# Patient Record
Sex: Male | Born: 1955 | Race: White | Hispanic: Yes | Marital: Married | Smoking: Former smoker
Health system: Southern US, Community
[De-identification: ages and names within clinical notes are randomized; demographics above are authoritative.]

## PROBLEM LIST (undated history)

## (undated) DIAGNOSIS — M1990 Unspecified osteoarthritis, unspecified site: Secondary | ICD-10-CM

## (undated) DIAGNOSIS — C801 Malignant (primary) neoplasm, unspecified: Secondary | ICD-10-CM

## (undated) DIAGNOSIS — E119 Type 2 diabetes mellitus without complications: Secondary | ICD-10-CM

## (undated) DIAGNOSIS — I10 Essential (primary) hypertension: Secondary | ICD-10-CM

## (undated) DIAGNOSIS — R4020 Unspecified coma: Secondary | ICD-10-CM

## (undated) DIAGNOSIS — J189 Pneumonia, unspecified organism: Secondary | ICD-10-CM

## (undated) DIAGNOSIS — E7800 Pure hypercholesterolemia, unspecified: Secondary | ICD-10-CM

## (undated) HISTORY — PX: SKIN GRAFT FULL THICKNESS LEG: SUR1299

## (undated) HISTORY — PX: CATARACT EXTRACTION, BILATERAL: SHX1313

## (undated) HISTORY — PX: FOOT SURGERY: SHX648

## (undated) HISTORY — PX: FRACTURE SURGERY: SHX138

---

## 1999-08-06 ENCOUNTER — Encounter: Payer: Self-pay | Admitting: Orthopedic Surgery

## 1999-08-06 ENCOUNTER — Ambulatory Visit (HOSPITAL_COMMUNITY): Admission: AD | Admit: 1999-08-06 | Discharge: 1999-08-07 | Payer: Self-pay | Admitting: Orthopedic Surgery

## 2003-08-27 ENCOUNTER — Emergency Department (HOSPITAL_COMMUNITY): Admission: EM | Admit: 2003-08-27 | Discharge: 2003-08-27 | Payer: Self-pay | Admitting: Emergency Medicine

## 2006-09-23 ENCOUNTER — Inpatient Hospital Stay (HOSPITAL_COMMUNITY): Admission: EM | Admit: 2006-09-23 | Discharge: 2006-09-27 | Payer: Self-pay | Admitting: Emergency Medicine

## 2006-09-23 ENCOUNTER — Ambulatory Visit: Payer: Self-pay | Admitting: Internal Medicine

## 2006-10-15 ENCOUNTER — Ambulatory Visit: Payer: Self-pay | Admitting: Internal Medicine

## 2007-03-19 ENCOUNTER — Emergency Department (HOSPITAL_COMMUNITY): Admission: EM | Admit: 2007-03-19 | Discharge: 2007-03-19 | Payer: Self-pay | Admitting: Emergency Medicine

## 2007-04-05 ENCOUNTER — Emergency Department (HOSPITAL_COMMUNITY): Admission: EM | Admit: 2007-04-05 | Discharge: 2007-04-05 | Payer: Self-pay | Admitting: Emergency Medicine

## 2007-08-06 ENCOUNTER — Emergency Department (HOSPITAL_COMMUNITY): Admission: EM | Admit: 2007-08-06 | Discharge: 2007-08-06 | Payer: Self-pay | Admitting: Emergency Medicine

## 2008-01-25 ENCOUNTER — Emergency Department (HOSPITAL_COMMUNITY): Admission: EM | Admit: 2008-01-25 | Discharge: 2008-01-25 | Payer: Self-pay | Admitting: Emergency Medicine

## 2008-05-18 ENCOUNTER — Encounter: Admission: RE | Admit: 2008-05-18 | Discharge: 2008-07-27 | Payer: Self-pay | Admitting: Orthopedic Surgery
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
## 2011-07-26 LAB — I-STAT 8, (EC8 V) (CONVERTED LAB)
Acid-base deficit: 1
BUN: 9
Bicarbonate: 24.3 — ABNORMAL HIGH
Glucose, Bld: 199 — ABNORMAL HIGH
HCT: 42
Hemoglobin: 14.3
Operator id: 235561
TCO2: 26
pCO2, Ven: 41.1 — ABNORMAL LOW
pH, Ven: 7.381 — ABNORMAL HIGH

## 2011-07-26 LAB — CBC
MCHC: 35.9
MCV: 86.8
Platelets: 342
RBC: 4.63
RDW: 13
WBC: 7

## 2011-07-26 LAB — POCT URINALYSIS DIP (DEVICE)
Glucose, UA: 100 — AB
Hgb urine dipstick: NEGATIVE
Ketones, ur: NEGATIVE
Nitrite: NEGATIVE
Operator id: 208841
Protein, ur: NEGATIVE
Specific Gravity, Urine: 1.02
pH: 6

## 2011-07-26 LAB — DIFFERENTIAL
Basophils Relative: 1
Lymphocytes Relative: 33
Monocytes Relative: 4

## 2011-07-26 LAB — POCT I-STAT CREATININE
Creatinine, Ser: 0.8
Operator id: 208841

## 2013-01-07 ENCOUNTER — Ambulatory Visit (INDEPENDENT_AMBULATORY_CARE_PROVIDER_SITE_OTHER): Payer: Self-pay | Admitting: Urology

## 2013-01-07 DIAGNOSIS — N529 Male erectile dysfunction, unspecified: Secondary | ICD-10-CM

## 2013-01-07 DIAGNOSIS — R972 Elevated prostate specific antigen [PSA]: Secondary | ICD-10-CM

## 2013-01-07 DIAGNOSIS — N320 Bladder-neck obstruction: Secondary | ICD-10-CM

## 2013-03-04 ENCOUNTER — Ambulatory Visit (INDEPENDENT_AMBULATORY_CARE_PROVIDER_SITE_OTHER): Payer: Self-pay | Admitting: Urology

## 2013-03-04 DIAGNOSIS — R351 Nocturia: Secondary | ICD-10-CM

## 2013-03-04 DIAGNOSIS — R972 Elevated prostate specific antigen [PSA]: Secondary | ICD-10-CM

## 2013-06-10 ENCOUNTER — Institutional Professional Consult (permissible substitution) (INDEPENDENT_AMBULATORY_CARE_PROVIDER_SITE_OTHER): Payer: Self-pay | Admitting: Urology

## 2013-06-10 DIAGNOSIS — C61 Malignant neoplasm of prostate: Secondary | ICD-10-CM

## 2013-07-11 ENCOUNTER — Ambulatory Visit (INDEPENDENT_AMBULATORY_CARE_PROVIDER_SITE_OTHER): Payer: Self-pay | Admitting: Urology

## 2013-07-11 DIAGNOSIS — N529 Male erectile dysfunction, unspecified: Secondary | ICD-10-CM

## 2013-07-11 DIAGNOSIS — C61 Malignant neoplasm of prostate: Secondary | ICD-10-CM

## 2013-07-11 DIAGNOSIS — N401 Enlarged prostate with lower urinary tract symptoms: Secondary | ICD-10-CM

## 2013-07-11 NOTE — Patient Instructions (Addendum)
Prostatectoma radical  (Radical Prostatectomy) La prostatectoma radical es un procedimiento para tratar el cncer mediante la eliminacin de toda la glndula prosttica. Se realiza si se ha diagnosticado que el cncer est limitado en la prstata. Tambin se elimina parte de los tejidos circundantes. Este procedimiento se realiza para Museum/gallery exhibitions officer. Tambin se realiza para evitar que se propague a otras partes del cuerpo (metstasis).  Tambin podrn extirparse de la pelvis los pequeos rganos de forma ovalada, conectados por vasos, que filtran las toxinas y clulas muertas del organismo (ganglios linfticos). Si el cncer ha hecho metstasis, Geophysicist/field seismologist cual se harn es en los ganglios linfticos de la pelvis, los ms cercanos a la prstata. El tejido del ganglio linftico que se extrae ser evaluado para diagnosticar si el cncer se ha metastatizado.  INFORME A SU MDICO SOBRE:   Alergias que sufra.  Medicamentos que [place], incluyendo vitaminas, hierbas, gotas oftlmicas, medicamentos de venta libre y cremas.  Cualquier complicacin que usted o los [employer] de su familia hayan tenido con el uso de anestsicos.  Enfermedades de [employer].  Cirugas previas.  Infecciones previas de la prstata.  Otros problemas de salud, incluyendo diabetes y problemas renales. RIESGOS Y COMPLICACIONES  En general, la prostatectoma radical es un procedimiento seguro. Sin embargo, como en todo procedimiento quirrgico, pueden ocurrir complicaciones. Las posibles complicaciones asociadas con la prostatectoma radical son:   Lesiones en el intestino o el recto (raro).  Obstruccin intestinal.  Cicatrices que pueden causar problemas en el flujo de la orina.  Imposibilidad de Scientist, physiological orina (incontinencia).  Lesiones en uno de los urteres o Equities trader.  Impotencia. Impotencia es la imposibilidad de Presenter, broadcasting.  Infecciones.  Cogulos de [employer].  La formacin de un quiste de lquido de los vasos linfticos (linfocele). ANTES DEL PROCEDIMIENTO  Usted no debe comer ni beber [employer] 8 horas antes de la [place] o como lo indique su mdico. Podr beber un sorbo de agua antes del procedimiento para tomar los medicamentos que su mdico le indique. La noche anterior a la ciruga podrn darle para beber un lquido que limpiar sus intestinos.  PROCEDIMIENTO  Este procedimiento se realiza con medicamentos que lo harn dormir (anestesia general). o anestesia raqudea. Si se utiliza la anestesia raqudea, Personal assistant despierto pero insensibilizado de la cintura [place]. Le colocarn un tubo delgado y flexible para drenar lquidos (catter) que se pasa a travs de la uretra hacia la vejiga. El catter drenar la orina de la vejiga durante el procedimiento y [place] se recupera.  Hay cuatro tipos principales de ciruga para la prostatectoma radical:   Prostatectoma radical retropbica: durante este procedimiento, el cirujano har un corte (incisin) desde debajo del ombligo hasta el hueso pbico.  Prostatectoma radical laparoscpica: durante este procedimiento, el cirujano hace varias incisiones pequeas en el abdomen en lugar de una grande. En las incisiones se insertarn instrumentos largos y delgados. El Secondary school teacher un tubo delgado y flexible con una cmara de video (laparoscopio) dentro de una de las incisiones. Esto le permite al cirujano observar el interior del abdomen durante el procedimiento.  Prostatectoma laparoscpica asistida por robot: durante este procedimiento, la prostatectoma laparoscpica se realiza con la ayuda de un brazo robtico. El cirujano controla el brazo robtico desde un ordenador cerca de la mesa de operaciones.  Prostatectoma radical perineal: durante este procedimiento, se hace una incisin en la piel entre el ano y la base del escroto (  perineo). Despus de la extirpacin de la prstata,  la uretra se sutura al cuello de la vejiga, sobre el catter. Un pequeo tubo llamado tubo de drenaje se insertar a travs de una de las incisiones para permitir que drene el exceso de lquido del vientre. El cirujano cerrar las incisiones con puntos de sutura. Le aplicarn medicamentos y vendajes (apsitos) sobre las incisiones.  DESPUS DEL PROCEDIMIENTO  Despus de la [place], lo llevarn a una sala de recuperacin. Permanecer en la sala de recuperacin hasta que se sienta lo suficientemente estable como para pasar a una habitacin. Cuando pueda levantarse de la cama, lo alentarn a moverse todo lo que pueda. Su mdico le indicar que use medias de compresin en las piernas. Estas medias ayudan a prevenir cogulos sanguneos. Tendr Personnel officer [facility] [place] [address] Sin embargo, despus una laparoscpica o ciruga asistida por robot, es posible que pueda volver a Energy manager da despus de la [place]. Generalmente el catter se retira 2 o 3 semanas despus de la intervencin.  Document Released: 06/26/2012 [facility] Patient Information 2014 [place], [place].

## 2013-08-11 ENCOUNTER — Encounter (HOSPITAL_COMMUNITY): Payer: Self-pay

## 2013-08-11 ENCOUNTER — Ambulatory Visit (HOSPITAL_COMMUNITY)
Admission: RE | Admit: 2013-08-11 | Discharge: 2013-08-11 | Disposition: A | Discharge status: Home / Self Care | Payer: Medicaid Other | Source: Ambulatory Visit | Attending: Urology | Admitting: Urology

## 2013-08-11 ENCOUNTER — Encounter (HOSPITAL_COMMUNITY): Payer: Self-pay | Admitting: Pharmacy Technician

## 2013-08-11 ENCOUNTER — Encounter (HOSPITAL_COMMUNITY)
Admission: RE | Admit: 2013-08-11 | Discharge: 2013-08-11 | Disposition: A | Discharge status: Home / Self Care | Payer: Medicaid Other | Source: Ambulatory Visit | Attending: Urology | Admitting: Urology

## 2013-08-11 DIAGNOSIS — Z01812 Encounter for preprocedural laboratory examination: Secondary | ICD-10-CM | POA: Insufficient documentation

## 2013-08-11 DIAGNOSIS — Z01818 Encounter for other preprocedural examination: Secondary | ICD-10-CM | POA: Insufficient documentation

## 2013-08-11 DIAGNOSIS — R918 Other nonspecific abnormal finding of lung field: Secondary | ICD-10-CM | POA: Insufficient documentation

## 2013-08-11 DIAGNOSIS — Z01810 Encounter for preprocedural cardiovascular examination: Secondary | ICD-10-CM | POA: Insufficient documentation

## 2013-08-11 HISTORY — DX: Unspecified coma: R40.20

## 2013-08-11 HISTORY — DX: Pneumonia, unspecified organism: J18.9

## 2013-08-11 HISTORY — DX: Type 2 diabetes mellitus without complications: E11.9

## 2013-08-11 HISTORY — DX: Pure hypercholesterolemia, unspecified: E78.00

## 2013-08-11 HISTORY — DX: Malignant (primary) neoplasm, unspecified: C80.1

## 2013-08-11 HISTORY — DX: Essential (primary) hypertension: I10

## 2013-08-11 LAB — CBC: MCV: 86 fL (ref 78.0–100.0)

## 2013-08-11 LAB — BASIC METABOLIC PANEL
BUN: 26 mg/dL — ABNORMAL HIGH (ref 6–23)
CO2: 25 mEq/L (ref 19–32)
Chloride: 101 mEq/L (ref 96–112)
Creatinine, Ser: 1.7 mg/dL — ABNORMAL HIGH (ref 0.50–1.35)
GFR calc non Af Amer: 44 mL/min — ABNORMAL LOW (ref >90)
Sodium: 138 mEq/L (ref 135–145)

## 2013-08-11 NOTE — Patient Instructions (Signed)
20 Jeremiah Baxter  08/11/2013   Your procedure is scheduled on: 08/20/13  Report to [facility] at 10:15 AM.  Call this number if you have problems the morning of surgery [phone]: [phone]   Remember:   Do not eat food or drink liquids After Midnight.     Take these medicines the morning of surgery with A SIP OF WATER: amlodipine   Do not wear jewelry, make-up or nail polish.  Do not wear lotions, powders, or perfumes. You may wear deodorant.  Do not shave 48 hours prior to surgery. Men may shave face and neck.  Do not bring valuables to the hospital.  Contacts, dentures or bridgework may not be worn into surgery.  Leave suitcase in the car. After surgery it may be brought to your room.  For patients admitted to the hospital, checkout time is 11:00 AM the day of discharge.   Please read over the following fact sheets that you were given: blood fact sheet Birdie Sons, RN  pre op nurse call if needed [phone]    FAILURE TO FOLLOW THESE INSTRUCTIONS MAY RESULT IN CANCELLATION OF YOUR SURGERY   Patient Signature: ___________________________________________

## 2013-08-11 NOTE — Progress Notes (Signed)
Please write surgery orders for this pt. Pt is having surgery 08/20/13.

## 2013-08-11 NOTE — Progress Notes (Signed)
08/11/13 1446  OBSTRUCTIVE SLEEP APNEA  Have you ever been diagnosed with sleep apnea through a sleep study? No  Do you snore loudly (loud enough to be heard through closed doors)?  1  Do you often feel tired, fatigued, or sleepy during the daytime? 0  Has anyone observed you stop breathing during your sleep? 0  Do you have, or are you being treated for high blood pressure? 1  BMI more than 35 kg/m2? 1  Age over 55 years old? 1  Neck circumference greater than 40 cm/18 inches? 0  Gender: 1  Obstructive Sleep Apnea Score 5  Score 4 or greater  Results sent to PCP

## 2013-08-15 ENCOUNTER — Other Ambulatory Visit: Payer: Self-pay | Admitting: Urology

## 2013-08-15 DIAGNOSIS — R9389 Abnormal findings on diagnostic imaging of other specified body structures: Secondary | ICD-10-CM

## 2013-08-15 DIAGNOSIS — R222 Localized swelling, mass and lump, trunk: Secondary | ICD-10-CM

## 2013-08-18 ENCOUNTER — Ambulatory Visit (HOSPITAL_COMMUNITY)
Admission: RE | Admit: 2013-08-18 | Discharge: 2013-08-18 | Disposition: A | Discharge status: Home / Self Care | Payer: Medicaid Other | Source: Ambulatory Visit | Attending: Urology | Admitting: Urology

## 2013-08-18 DIAGNOSIS — R9389 Abnormal findings on diagnostic imaging of other specified body structures: Secondary | ICD-10-CM

## 2013-08-18 DIAGNOSIS — R911 Solitary pulmonary nodule: Secondary | ICD-10-CM | POA: Insufficient documentation

## 2013-08-18 DIAGNOSIS — R222 Localized swelling, mass and lump, trunk: Secondary | ICD-10-CM

## 2013-08-18 DIAGNOSIS — R918 Other nonspecific abnormal finding of lung field: Secondary | ICD-10-CM | POA: Insufficient documentation

## 2013-08-19 ENCOUNTER — Other Ambulatory Visit: Payer: Self-pay | Admitting: Urology
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
## 2013-08-19 NOTE — Progress Notes (Signed)
Called [facility] and left message with Lossie Faes that orders needed for patient for surgery on 08/20/13.

## 2013-08-20 ENCOUNTER — Encounter (HOSPITAL_COMMUNITY): Payer: Self-pay | Admitting: Anesthesiology

## 2013-08-20 ENCOUNTER — Ambulatory Visit (HOSPITAL_COMMUNITY): Payer: Medicaid Other

## 2013-08-20 ENCOUNTER — Ambulatory Visit (HOSPITAL_COMMUNITY)
Admission: RE | Admit: 2013-08-20 | Discharge: 2013-08-20 | Disposition: A | Discharge status: Home / Self Care | Payer: Medicaid Other | Source: Ambulatory Visit | Attending: Urology | Admitting: Urology

## 2013-08-20 ENCOUNTER — Ambulatory Visit (HOSPITAL_COMMUNITY): Payer: Self-pay | Admitting: Anesthesiology

## 2013-08-20 ENCOUNTER — Encounter (HOSPITAL_COMMUNITY)
Admission: RE | Disposition: A | Discharge status: Home / Self Care | Payer: Self-pay | Source: Ambulatory Visit | Attending: Urology

## 2013-08-20 DIAGNOSIS — Z79899 Other long term (current) drug therapy: Secondary | ICD-10-CM | POA: Insufficient documentation

## 2013-08-20 DIAGNOSIS — N529 Male erectile dysfunction, unspecified: Secondary | ICD-10-CM | POA: Insufficient documentation

## 2013-08-20 DIAGNOSIS — Z87891 Personal history of nicotine dependence: Secondary | ICD-10-CM | POA: Insufficient documentation

## 2013-08-20 DIAGNOSIS — C61 Malignant neoplasm of prostate: Secondary | ICD-10-CM | POA: Insufficient documentation

## 2013-08-20 DIAGNOSIS — Z5309 Procedure and treatment not carried out because of other contraindication: Secondary | ICD-10-CM | POA: Insufficient documentation

## 2013-08-20 DIAGNOSIS — R351 Nocturia: Secondary | ICD-10-CM | POA: Insufficient documentation

## 2013-08-20 DIAGNOSIS — S8990XA Unspecified injury of unspecified lower leg, initial encounter: Secondary | ICD-10-CM | POA: Insufficient documentation

## 2013-08-20 DIAGNOSIS — N320 Bladder-neck obstruction: Secondary | ICD-10-CM | POA: Insufficient documentation

## 2013-08-20 DIAGNOSIS — X58XXXA Exposure to other specified factors, initial encounter: Secondary | ICD-10-CM | POA: Insufficient documentation

## 2013-08-20 DIAGNOSIS — I10 Essential (primary) hypertension: Secondary | ICD-10-CM | POA: Insufficient documentation

## 2013-08-20 LAB — TYPE AND SCREEN
ABO/RH(D): B POS
Antibody Screen: NEGATIVE

## 2013-08-20 LAB — GLUCOSE, CAPILLARY: Glucose-Capillary: 70 mg/dL (ref 70–99)

## 2013-08-20 LAB — BASIC METABOLIC PANEL
Calcium: 10.3 mg/dL (ref 8.4–10.5)
Chloride: 103 mEq/L (ref 96–112)
Creatinine, Ser: 1.75 mg/dL — ABNORMAL HIGH (ref 0.50–1.35)
GFR calc Af Amer: 49 mL/min — ABNORMAL LOW (ref >90)
Sodium: 140 mEq/L (ref 135–145)

## 2013-08-20 SURGERY — ROBOTIC ASSISTED LAPAROSCOPIC RADICAL PROSTATECTOMY
Anesthesia: General

## 2013-08-20 MED ORDER — CEFAZOLIN SODIUM-DEXTROSE 2-3 GM-% IV SOLR
INTRAVENOUS | Status: AC
  Filled 2013-08-20: qty 50

## 2013-08-20 MED ORDER — CEFAZOLIN SODIUM-DEXTROSE 2-3 GM-% IV SOLR: 2.0000 g | INTRAVENOUS | Status: DC

## 2013-08-20 MED ORDER — HYDROCODONE-ACETAMINOPHEN 5-325 MG PO TABS: 1.0000 | ORAL_TABLET | Freq: Four times a day (QID) | ORAL | Status: DC | PRN

## 2013-08-20 MED ORDER — CIPROFLOXACIN HCL 500 MG PO TABS: 500.0000 mg | ORAL_TABLET | Freq: Two times a day (BID) | ORAL | Status: DC

## 2013-08-20 MED ORDER — FLEET ENEMA 7-19 GM/118ML RE ENEM: 1.0000 | ENEMA | Freq: Once | RECTAL | Status: DC

## 2013-08-20 MED ORDER — DEXTROSE 50 % IV SOLN
25.0000 mL | Freq: Once | INTRAVENOUS | Status: AC
  Administered 2013-08-20: 25 mL via INTRAVENOUS
  Filled 2013-08-20: qty 50

## 2013-08-20 MED ORDER — LACTATED RINGERS IV SOLN: INTRAVENOUS | Status: DC

## 2013-08-20 NOTE — Interval H&P Note (Signed)
History and Physical Interval Note:  Mr. Jeremiah Baxter dropped a bucket on his foot last Weds and on Saturday he began to have painful swelling and redness.   He was seen at the [facility] clinic on [address] yesterday and was given Rocephin.  He had another shot this morning and has an oral antibiotic available.    He still has erythema and swelling on exam and this is on his left where he has had prior fasciotomies.  He is also a diabetic.   I have decided to cancel his surgery and to get foot films to r/o a fracture.  If there is no fracture, I will have him take his antibiotics and f/u in a couple of weeks for reassessment.  If there is a fracture, I will have him set up to see orthopedics.    08/20/2013 12:27 PM  Jeremiah Baxter  has presented today for surgery, with the diagnosis of PROSTATE CANCER   The various methods of treatment have been discussed with the patient and family. After consideration of risks, benefits and other options for treatment, the patient has consented to  Procedure(s): ROBOTIC ASSISTED LAPAROSCOPIC PROSTATECTOMY (N/A) as a surgical intervention .  The patient's history has been reviewed, patient examined, no change in status, stable for surgery.  I have reviewed the patient's chart and labs.  Questions were answered to the patient's satisfaction.     Jeremiah Baxter J

## 2013-08-20 NOTE — Progress Notes (Signed)
Dr. Annabell Howells was notified that patient has an inflammed Lt foot. He has been treated with antibiotics from an urgent care physician and temp is 98.6 today

## 2013-08-20 NOTE — Progress Notes (Signed)
Pt. Presents to holding room to be prepped for prostatectomy surgery.  C/o pain to upper aspect of left foot.  States he dropped [place] empty bucket on this are last Wednesday.  Area did not originally hurt, but has become increasingly painful over last few days.  This increases especially with activity.  Ara is reddened and warm to touch.  Dr. Annabell Howells made aware of compalaints and diabetic history.  Pt. Sent for Xray of foot.  Surgery Cancelled and pt. To follow up with regular physician for management.  To be transferred back to short stay for discharge after radiology complete.

## 2013-09-04 ENCOUNTER — Other Ambulatory Visit: Payer: Self-pay | Admitting: Urology

## 2013-09-15 ENCOUNTER — Encounter (HOSPITAL_COMMUNITY): Payer: Self-pay | Admitting: Pharmacy Technician

## 2013-09-17 ENCOUNTER — Encounter (HOSPITAL_COMMUNITY): Payer: Self-pay | Admitting: Pharmacy Technician

## 2013-09-18 ENCOUNTER — Inpatient Hospital Stay (HOSPITAL_COMMUNITY): Admission: RE | Admit: 2013-09-18 | Payer: Self-pay | Source: Ambulatory Visit

## 2013-09-19 ENCOUNTER — Encounter (HOSPITAL_COMMUNITY): Payer: Self-pay

## 2013-09-19 ENCOUNTER — Encounter (HOSPITAL_COMMUNITY)
Admission: RE | Admit: 2013-09-19 | Discharge: 2013-09-19 | Disposition: A | Discharge status: Home / Self Care | Payer: Medicaid Other | Source: Ambulatory Visit | Attending: Urology | Admitting: Urology

## 2013-09-19 DIAGNOSIS — C61 Malignant neoplasm of prostate: Secondary | ICD-10-CM | POA: Insufficient documentation

## 2013-09-19 DIAGNOSIS — Z01812 Encounter for preprocedural laboratory examination: Secondary | ICD-10-CM | POA: Insufficient documentation

## 2013-09-19 LAB — CBC
HCT: 39.5 % (ref 39.0–52.0)
Hemoglobin: 13.9 g/dL (ref 13.0–17.0)
MCHC: 35.2 g/dL (ref 30.0–36.0)
MCV: 86.2 fL (ref 78.0–100.0)
Platelets: 268 10*3/uL (ref 150–400)
RBC: 4.58 MIL/uL (ref 4.22–5.81)
WBC: 5.8 10*3/uL (ref 4.0–10.5)

## 2013-09-19 LAB — BASIC METABOLIC PANEL
BUN: 26 mg/dL — ABNORMAL HIGH (ref 6–23)
CO2: 24 mEq/L (ref 19–32)
Calcium: 9.6 mg/dL (ref 8.4–10.5)
Chloride: 103 mEq/L (ref 96–112)
Creatinine, Ser: 1.41 mg/dL — ABNORMAL HIGH (ref 0.50–1.35)
GFR calc Af Amer: 63 mL/min — ABNORMAL LOW (ref >90)
Glucose, Bld: 105 mg/dL — ABNORMAL HIGH (ref 70–99)

## 2013-09-19 NOTE — Patient Instructions (Addendum)
Jeremiah Baxter  09/19/2013   Your procedure is scheduled on: 12-10  -2014  Report to [facility] at      1000  AM.  Call this number if you have problems the morning of surgery: [phone]  Or Presurgical Testing [phone]([name])   Remember: Follow any bowel prep instructions per MD office. For Cpap use: Bring mask and tubing only.   Do not eat food:After Midnight.  May have clear liquids:up to 6 Hours before arrival. Nothing after :  Clear liquids include soda, tea, black coffee, apple or grape juice, broth.  Take these medicines the morning of surgery with A SIP OF WATER: Amlodipine only. Donot take any Diabetic meds or Lisinopril AM of surgery.   Do not wear jewelry, make-up or nail polish.  Do not wear lotions, powders, or perfumes. You may wear deodorant.  Do not shave 12 hours prior to first CHG shower(legs and under arms).(face and neck okay.)  Do not bring valuables to the hospital.  Contacts, dentures or removable bridgework, body piercing, hair pins may not be worn into surgery.  Leave suitcase in the car. After surgery it may be brought to your room.  For patients admitted to the hospital, checkout time is 11:00 AM the day of discharge.   Patients discharged the day of surgery will not be allowed to drive home. Must have responsible person with you x 24 hours once discharged.  Name and phone number of your driver: Jeremiah Baxter,spouse(Spanish), son Jeremiah Baxter. [phone] cell  Special Instructions: CHG(Chlorhedine 4%-"Hibiclens","Betasept","Aplicare") Shower Use Special Wash: see special instructions.(avoid face and genitals)   Please read over the following fact sheets that you were given:  Blood Transfusion fact sheet, Incentive Spirometry Instruction.  Remember : Type/Screen "Blue armbands" - may not be removed once applied(would result in being retested if removed).  Failure to follow these instructions may result in Cancellation of your  surgery.   Patient signature_______________________________________________________

## 2013-09-19 NOTE — Progress Notes (Signed)
Your Pt has screened with an elevated risk for obstructive sleep apnea using the Stop-Bang tool during a presurgical  Visit. A score of four or greater is an elevated risk. 

## 2013-09-19 NOTE — Pre-Procedure Instructions (Addendum)
09-19-13 EKG/ CT Chest 10'14-Epic. Ambulates with limping gait-"right leg shorter than left), denies any discomfort or problems left foot.W.Kanylah Muench,RN 09-19-13 Dr. Lyndon Code note of Stop/Bang score= 4. 09-19-13 1210 Labs viewale in Epic-, note sent to Dr. Belva Crome office. W. Kennon Portela

## 2013-09-19 NOTE — Progress Notes (Signed)
09-19-13 1210 labs viewable in Epic.

## 2013-09-23 NOTE — Progress Notes (Signed)
Called Dr Tinnie Gens Hooper's office for last office visit note and was informed that this is not patient's primary physician.

## 2013-09-24 ENCOUNTER — Encounter (HOSPITAL_COMMUNITY): Payer: Medicaid Other | Admitting: Anesthesiology

## 2013-09-24 ENCOUNTER — Encounter (HOSPITAL_COMMUNITY)
Admission: RE | Disposition: A | Discharge status: Home / Self Care | Payer: Self-pay | Source: Ambulatory Visit | Attending: Urology

## 2013-09-24 ENCOUNTER — Encounter (HOSPITAL_COMMUNITY): Payer: Self-pay | Admitting: *Deleted

## 2013-09-24 ENCOUNTER — Ambulatory Visit (HOSPITAL_COMMUNITY): Payer: Medicaid Other | Admitting: Anesthesiology

## 2013-09-24 ENCOUNTER — Inpatient Hospital Stay (HOSPITAL_COMMUNITY)
Admission: RE | Admit: 2013-09-24 | Discharge: 2013-09-26 | DRG: 708 | Disposition: A | Discharge status: Home / Self Care | Payer: Medicaid Other | Source: Ambulatory Visit | Attending: Urology | Admitting: Urology

## 2013-09-24 DIAGNOSIS — E119 Type 2 diabetes mellitus without complications: Secondary | ICD-10-CM | POA: Diagnosis present

## 2013-09-24 DIAGNOSIS — G5600 Carpal tunnel syndrome, unspecified upper limb: Secondary | ICD-10-CM | POA: Diagnosis present

## 2013-09-24 DIAGNOSIS — G5620 Lesion of ulnar nerve, unspecified upper limb: Secondary | ICD-10-CM | POA: Diagnosis present

## 2013-09-24 DIAGNOSIS — Z87891 Personal history of nicotine dependence: Secondary | ICD-10-CM

## 2013-09-24 DIAGNOSIS — N320 Bladder-neck obstruction: Secondary | ICD-10-CM | POA: Diagnosis present

## 2013-09-24 DIAGNOSIS — Z79899 Other long term (current) drug therapy: Secondary | ICD-10-CM

## 2013-09-24 DIAGNOSIS — I10 Essential (primary) hypertension: Secondary | ICD-10-CM | POA: Diagnosis present

## 2013-09-24 DIAGNOSIS — Z23 Encounter for immunization: Secondary | ICD-10-CM

## 2013-09-24 DIAGNOSIS — N529 Male erectile dysfunction, unspecified: Secondary | ICD-10-CM | POA: Diagnosis present

## 2013-09-24 DIAGNOSIS — G5601 Carpal tunnel syndrome, right upper limb: Secondary | ICD-10-CM

## 2013-09-24 DIAGNOSIS — C61 Malignant neoplasm of prostate: Secondary | ICD-10-CM | POA: Diagnosis present

## 2013-09-24 DIAGNOSIS — G5622 Lesion of ulnar nerve, left upper limb: Secondary | ICD-10-CM

## 2013-09-24 DIAGNOSIS — Z8701 Personal history of pneumonia (recurrent): Secondary | ICD-10-CM

## 2013-09-24 DIAGNOSIS — G589 Mononeuropathy, unspecified: Secondary | ICD-10-CM | POA: Diagnosis not present

## 2013-09-24 DIAGNOSIS — E7800 Pure hypercholesterolemia, unspecified: Secondary | ICD-10-CM | POA: Diagnosis present

## 2013-09-24 DIAGNOSIS — Z8249 Family history of ischemic heart disease and other diseases of the circulatory system: Secondary | ICD-10-CM

## 2013-09-24 HISTORY — PX: ROBOT ASSISTED LAPAROSCOPIC RADICAL PROSTATECTOMY: SHX5141

## 2013-09-24 LAB — TYPE AND SCREEN: ABO/RH(D): B POS

## 2013-09-24 LAB — BASIC METABOLIC PANEL
CO2: 22 mEq/L (ref 19–32)
Calcium: 8.7 mg/dL (ref 8.4–10.5)
Creatinine, Ser: 1.76 mg/dL — ABNORMAL HIGH (ref 0.50–1.35)
GFR calc non Af Amer: 42 mL/min — ABNORMAL LOW (ref >90)
Glucose, Bld: 184 mg/dL — ABNORMAL HIGH (ref 70–99)

## 2013-09-24 LAB — GLUCOSE, CAPILLARY: Glucose-Capillary: 198 mg/dL — ABNORMAL HIGH (ref 70–99)

## 2013-09-24 LAB — HEMOGLOBIN AND HEMATOCRIT, BLOOD: HCT: 36.3 % — ABNORMAL LOW (ref 39.0–52.0)

## 2013-09-24 SURGERY — ROBOTIC ASSISTED LAPAROSCOPIC RADICAL PROSTATECTOMY
Anesthesia: General

## 2013-09-24 MED ORDER — AMLODIPINE BESYLATE 10 MG PO TABS
10.0000 mg | ORAL_TABLET | Freq: Every morning | ORAL | Status: DC
  Administered 2013-09-25 – 2013-09-26 (×2): 10 mg via ORAL
  Filled 2013-09-24 (×2): qty 1

## 2013-09-24 MED ORDER — FENTANYL CITRATE 0.05 MG/ML IJ SOLN
INTRAMUSCULAR | Status: DC | PRN
  Administered 2013-09-24: 100 ug via INTRAVENOUS
  Administered 2013-09-24 (×2): 25 ug via INTRAVENOUS
  Administered 2013-09-24: 100 ug via INTRAVENOUS
  Administered 2013-09-24 (×4): 25 ug via INTRAVENOUS

## 2013-09-24 MED ORDER — BISACODYL 10 MG RE SUPP: 10.0000 mg | Freq: Every day | RECTAL | Status: DC | PRN

## 2013-09-24 MED ORDER — ONDANSETRON HCL 4 MG/2ML IJ SOLN
INTRAMUSCULAR | Status: AC
  Filled 2013-09-24: qty 2

## 2013-09-24 MED ORDER — FENTANYL CITRATE 0.05 MG/ML IJ SOLN
INTRAMUSCULAR | Status: AC
  Filled 2013-09-24: qty 5

## 2013-09-24 MED ORDER — SODIUM CHLORIDE 0.9 % IJ SOLN
INTRAMUSCULAR | Status: AC
  Filled 2013-09-24: qty 10

## 2013-09-24 MED ORDER — NEOSTIGMINE METHYLSULFATE 1 MG/ML IJ SOLN
INTRAMUSCULAR | Status: DC | PRN
  Administered 2013-09-24: 5 mg via INTRAVENOUS

## 2013-09-24 MED ORDER — PROPOFOL 10 MG/ML IV BOLUS
INTRAVENOUS | Status: DC | PRN
  Administered 2013-09-24: 200 mg via INTRAVENOUS

## 2013-09-24 MED ORDER — LACTATED RINGERS IV SOLN: INTRAVENOUS | Status: DC

## 2013-09-24 MED ORDER — EPHEDRINE SULFATE 50 MG/ML IJ SOLN
INTRAMUSCULAR | Status: DC | PRN
  Administered 2013-09-24 (×2): 10 mg via INTRAVENOUS
  Administered 2013-09-24 (×3): 5 mg via INTRAVENOUS
  Administered 2013-09-24: 10 mg via INTRAVENOUS
  Administered 2013-09-24: 5 mg via INTRAVENOUS
  Administered 2013-09-24: 10 mg via INTRAVENOUS

## 2013-09-24 MED ORDER — PROMETHAZINE HCL 25 MG/ML IJ SOLN
INTRAMUSCULAR | Status: AC
  Filled 2013-09-24: qty 1

## 2013-09-24 MED ORDER — LIDOCAINE HCL (CARDIAC) 20 MG/ML IV SOLN
INTRAVENOUS | Status: DC | PRN
  Administered 2013-09-24: 100 mg via INTRAVENOUS

## 2013-09-24 MED ORDER — HYDROMORPHONE HCL PF 2 MG/ML IJ SOLN
INTRAMUSCULAR | Status: AC
  Filled 2013-09-24: qty 1

## 2013-09-24 MED ORDER — MIDAZOLAM HCL 5 MG/5ML IJ SOLN
INTRAMUSCULAR | Status: DC | PRN
  Administered 2013-09-24: 2 mg via INTRAVENOUS

## 2013-09-24 MED ORDER — HYDROCHLOROTHIAZIDE 25 MG PO TABS
25.0000 mg | ORAL_TABLET | Freq: Every day | ORAL | Status: DC
  Administered 2013-09-25 – 2013-09-26 (×2): 25 mg via ORAL
  Filled 2013-09-24 (×2): qty 1

## 2013-09-24 MED ORDER — ONDANSETRON HCL 4 MG/2ML IJ SOLN
INTRAMUSCULAR | Status: DC | PRN
  Administered 2013-09-24 (×2): 2 mg via INTRAVENOUS

## 2013-09-24 MED ORDER — BUPIVACAINE-EPINEPHRINE PF 0.25-1:200000 % IJ SOLN
INTRAMUSCULAR | Status: AC
  Filled 2013-09-24: qty 30

## 2013-09-24 MED ORDER — GLYCOPYRROLATE 0.2 MG/ML IJ SOLN
INTRAMUSCULAR | Status: DC | PRN
  Administered 2013-09-24: 0.6 mg via INTRAVENOUS

## 2013-09-24 MED ORDER — SODIUM CHLORIDE 0.9 % IR SOLN
Status: DC | PRN
  Administered 2013-09-24: 1000 mL via INTRAVESICAL

## 2013-09-24 MED ORDER — MIDAZOLAM HCL 2 MG/2ML IJ SOLN
INTRAMUSCULAR | Status: AC
  Filled 2013-09-24: qty 2

## 2013-09-24 MED ORDER — GLYCOPYRROLATE 0.2 MG/ML IJ SOLN
INTRAMUSCULAR | Status: AC
  Filled 2013-09-24: qty 1

## 2013-09-24 MED ORDER — ROCURONIUM BROMIDE 100 MG/10ML IV SOLN
INTRAVENOUS | Status: AC
  Filled 2013-09-24: qty 1

## 2013-09-24 MED ORDER — FENTANYL CITRATE 0.05 MG/ML IJ SOLN
INTRAMUSCULAR | Status: AC
  Filled 2013-09-24: qty 2

## 2013-09-24 MED ORDER — SODIUM CHLORIDE 0.9 % IV SOLN
INTRAVENOUS | Status: DC | PRN
  Administered 2013-09-24: 17:00:00 via INTRAVENOUS

## 2013-09-24 MED ORDER — INFLUENZA VAC SPLIT QUAD 0.5 ML IM SUSP
0.5000 mL | INTRAMUSCULAR | Status: AC
  Administered 2013-09-25: 09:00:00 0.5 mL via INTRAMUSCULAR
  Filled 2013-09-24 (×2): qty 0.5

## 2013-09-24 MED ORDER — HYDROMORPHONE HCL PF 1 MG/ML IJ SOLN
0.2500 mg | INTRAMUSCULAR | Status: DC | PRN
  Administered 2013-09-24 (×3): 0.5 mg via INTRAVENOUS
  Administered 2013-09-24: 0.25 mg via INTRAVENOUS

## 2013-09-24 MED ORDER — LISINOPRIL 20 MG PO TABS
20.0000 mg | ORAL_TABLET | Freq: Every day | ORAL | Status: DC
  Administered 2013-09-25 – 2013-09-26 (×2): 20 mg via ORAL
  Filled 2013-09-24 (×2): qty 1

## 2013-09-24 MED ORDER — DIPHENHYDRAMINE HCL 12.5 MG/5ML PO ELIX: 12.5000 mg | ORAL_SOLUTION | Freq: Four times a day (QID) | ORAL | Status: DC | PRN

## 2013-09-24 MED ORDER — PROPOFOL 10 MG/ML IV BOLUS
INTRAVENOUS | Status: AC
  Filled 2013-09-24: qty 20

## 2013-09-24 MED ORDER — HYDROMORPHONE BOLUS VIA INFUSION
INTRAVENOUS | Status: DC | PRN
  Administered 2013-09-24 (×4): 0.5 mg via INTRAVENOUS

## 2013-09-24 MED ORDER — SIMVASTATIN 10 MG PO TABS
10.0000 mg | ORAL_TABLET | Freq: Every day | ORAL | Status: DC
  Administered 2013-09-24 – 2013-09-25 (×2): 10 mg via ORAL
  Filled 2013-09-24 (×3): qty 1

## 2013-09-24 MED ORDER — LACTATED RINGERS IV SOLN
INTRAVENOUS | Status: DC | PRN
  Administered 2013-09-24: 12:00:00 via INTRAVENOUS

## 2013-09-24 MED ORDER — ACETAMINOPHEN 325 MG PO TABS: 650.0000 mg | ORAL_TABLET | ORAL | Status: DC | PRN

## 2013-09-24 MED ORDER — LISINOPRIL-HYDROCHLOROTHIAZIDE 20-25 MG PO TABS: 1.0000 | ORAL_TABLET | Freq: Every morning | ORAL | Status: DC

## 2013-09-24 MED ORDER — HYDROMORPHONE HCL PF 1 MG/ML IJ SOLN
INTRAMUSCULAR | Status: AC
  Filled 2013-09-24: qty 1

## 2013-09-24 MED ORDER — ONDANSETRON HCL 4 MG/2ML IJ SOLN: 4.0000 mg | INTRAMUSCULAR | Status: DC | PRN

## 2013-09-24 MED ORDER — CEFAZOLIN SODIUM-DEXTROSE 2-3 GM-% IV SOLR
2.0000 g | INTRAVENOUS | Status: AC
  Administered 2013-09-24: 2 g via INTRAVENOUS

## 2013-09-24 MED ORDER — CEFAZOLIN SODIUM-DEXTROSE 2-3 GM-% IV SOLR
INTRAVENOUS | Status: AC
  Filled 2013-09-24: qty 50

## 2013-09-24 MED ORDER — DIPHENHYDRAMINE HCL 50 MG/ML IJ SOLN: 12.5000 mg | Freq: Four times a day (QID) | INTRAMUSCULAR | Status: DC | PRN

## 2013-09-24 MED ORDER — EPHEDRINE SULFATE 50 MG/ML IJ SOLN
INTRAMUSCULAR | Status: AC
  Filled 2013-09-24: qty 1

## 2013-09-24 MED ORDER — ZOLPIDEM TARTRATE 5 MG PO TABS
5.0000 mg | ORAL_TABLET | Freq: Every evening | ORAL | Status: DC | PRN
  Administered 2013-09-25: 22:00:00 5 mg via ORAL
  Filled 2013-09-24: qty 1

## 2013-09-24 MED ORDER — HYOSCYAMINE SULFATE 0.125 MG SL SUBL
0.1250 mg | SUBLINGUAL_TABLET | SUBLINGUAL | Status: DC | PRN
  Administered 2013-09-24: 22:00:00 0.125 mg via ORAL
  Filled 2013-09-24: qty 1

## 2013-09-24 MED ORDER — NEOSTIGMINE METHYLSULFATE 1 MG/ML IJ SOLN
INTRAMUSCULAR | Status: AC
Start: 2013-09-24 — End: 2013-09-24
  Filled 2013-09-24: qty 10

## 2013-09-24 MED ORDER — INSULIN ASPART 100 UNIT/ML ~~LOC~~ SOLN
0.0000 [IU] | Freq: Three times a day (TID) | SUBCUTANEOUS | Status: DC
  Administered 2013-09-25: 2 [IU] via SUBCUTANEOUS
  Administered 2013-09-25: 3 [IU] via SUBCUTANEOUS
  Administered 2013-09-26: 5 [IU] via SUBCUTANEOUS

## 2013-09-24 MED ORDER — CEFAZOLIN SODIUM 1-5 GM-% IV SOLN
1.0000 g | Freq: Three times a day (TID) | INTRAVENOUS | Status: DC
  Administered 2013-09-24 – 2013-09-26 (×5): 1 g via INTRAVENOUS
  Filled 2013-09-24 (×8): qty 50

## 2013-09-24 MED ORDER — PNEUMOCOCCAL VAC POLYVALENT 25 MCG/0.5ML IJ INJ
0.5000 mL | INJECTION | INTRAMUSCULAR | Status: AC
  Administered 2013-09-25: 09:00:00 0.5 mL via INTRAMUSCULAR
  Filled 2013-09-24 (×2): qty 0.5

## 2013-09-24 MED ORDER — POTASSIUM CHLORIDE IN NACL 20-0.45 MEQ/L-% IV SOLN
1000.0000 mL | INTRAVENOUS | Status: DC
  Administered 2013-09-24 – 2013-09-26 (×5): 1000 mL via INTRAVENOUS
  Filled 2013-09-24 (×11): qty 1000

## 2013-09-24 MED ORDER — HYDROMORPHONE HCL PF 1 MG/ML IJ SOLN
0.5000 mg | INTRAMUSCULAR | Status: DC | PRN
  Administered 2013-09-24 – 2013-09-25 (×5): 1 mg via INTRAVENOUS
  Filled 2013-09-24 (×5): qty 1

## 2013-09-24 MED ORDER — HEPARIN SODIUM (PORCINE) 1000 UNIT/ML IJ SOLN
INTRAMUSCULAR | Status: AC
  Filled 2013-09-24: qty 1

## 2013-09-24 MED ORDER — ROCURONIUM BROMIDE 100 MG/10ML IV SOLN
INTRAVENOUS | Status: DC | PRN
  Administered 2013-09-24: 10 mg via INTRAVENOUS
  Administered 2013-09-24: 25 mg via INTRAVENOUS
  Administered 2013-09-24: 60 mg via INTRAVENOUS
  Administered 2013-09-24: 10 mg via INTRAVENOUS
  Administered 2013-09-24: 5 mg via INTRAVENOUS
  Administered 2013-09-24 (×2): 10 mg via INTRAVENOUS

## 2013-09-24 MED ORDER — LIDOCAINE HCL (CARDIAC) 20 MG/ML IV SOLN
INTRAVENOUS | Status: AC
Start: 2013-09-24 — End: 2013-09-24
  Filled 2013-09-24: qty 5

## 2013-09-24 MED ORDER — OXYCODONE-ACETAMINOPHEN 5-325 MG PO TABS
1.0000 | ORAL_TABLET | ORAL | Status: DC | PRN
  Administered 2013-09-25 – 2013-09-26 (×3): 2 via ORAL
  Filled 2013-09-24 (×4): qty 2

## 2013-09-24 MED ORDER — KETAMINE HCL 10 MG/ML IJ SOLN
INTRAMUSCULAR | Status: DC | PRN
  Administered 2013-09-24: 10 mg via INTRAVENOUS
  Administered 2013-09-24 (×2): 20 mg via INTRAVENOUS

## 2013-09-24 MED ORDER — LACTATED RINGERS IV SOLN
INTRAVENOUS | Status: DC | PRN
  Administered 2013-09-24: 14:00:00

## 2013-09-24 MED ORDER — BUPIVACAINE-EPINEPHRINE 0.25% -1:200000 IJ SOLN
INTRAMUSCULAR | Status: DC | PRN
  Administered 2013-09-24: 20 mL

## 2013-09-24 MED ORDER — DOCUSATE SODIUM 100 MG PO CAPS
100.0000 mg | ORAL_CAPSULE | Freq: Two times a day (BID) | ORAL | Status: DC
  Administered 2013-09-24 – 2013-09-26 (×4): 100 mg via ORAL
  Filled 2013-09-24 (×5): qty 1

## 2013-09-24 MED ORDER — PROMETHAZINE HCL 25 MG/ML IJ SOLN
12.5000 mg | INTRAMUSCULAR | Status: DC | PRN
Start: 2013-09-24 — End: 2013-09-24
  Administered 2013-09-24: 6.25 mg via INTRAVENOUS

## 2013-09-24 SURGICAL SUPPLY — 49 items
APL ESCP 34 STRL LF DISP (HEMOSTASIS) ×1
APPLICATOR SURGIFLO ENDO (HEMOSTASIS) ×1 IMPLANT
CABLE HIGH FREQUENCY MONO STRZ (ELECTRODE) ×1 IMPLANT
CANISTER SUCTION 2500CC (MISCELLANEOUS) ×2 IMPLANT
CATH FOLEY 2WAY SLVR 18FR 30CC (CATHETERS) ×2 IMPLANT
CATH ROBINSON RED A/P 16FR (CATHETERS) ×2 IMPLANT
CATH ROBINSON RED A/P 8FR (CATHETERS) ×2 IMPLANT
CATH TIEMANN FOLEY 18FR 5CC (CATHETERS) ×2 IMPLANT
CATH URET 5FR 28IN OPEN ENDED (CATHETERS) IMPLANT
CHLORAPREP W/TINT 26ML (MISCELLANEOUS) ×2 IMPLANT
CORD HIGH FREQUENCY UNIPOLAR (ELECTROSURGICAL) ×2 IMPLANT
COVER SURGICAL LIGHT HANDLE (MISCELLANEOUS) ×2 IMPLANT
COVER TIP SHEARS 8 DVNC (MISCELLANEOUS) ×1 IMPLANT
COVER TIP SHEARS 8MM DA VINCI (MISCELLANEOUS) ×1
CUTTER ECHEON FLEX ENDO 45 340 (ENDOMECHANICALS) ×2 IMPLANT
DECANTER SPIKE VIAL GLASS SM (MISCELLANEOUS) ×1 IMPLANT
DRAPE SURG IRRIG POUCH 19X23 (DRAPES) ×2 IMPLANT
DRSG TEGADERM 2-3/8X2-3/4 SM (GAUZE/BANDAGES/DRESSINGS) ×8 IMPLANT
DRSG TEGADERM 4X4.75 (GAUZE/BANDAGES/DRESSINGS) ×4 IMPLANT
DRSG TEGADERM 6X8 (GAUZE/BANDAGES/DRESSINGS) ×4 IMPLANT
ELECT REM PT RETURN 9FT ADLT (ELECTROSURGICAL) ×2
ELECTRODE REM PT RTRN 9FT ADLT (ELECTROSURGICAL) ×1 IMPLANT
FLOSEAL 10ML (HEMOSTASIS) ×1 IMPLANT
GAUZE SPONGE 2X2 8PLY STRL LF (GAUZE/BANDAGES/DRESSINGS) ×1 IMPLANT
GLOVE BIO SURGEON STRL SZ 6.5 (GLOVE) ×2 IMPLANT
GLOVE BIO SURGEON STRL SZ7.5 (GLOVE) ×3 IMPLANT
GLOVE SURG SS PI 8.0 STRL IVOR (GLOVE) IMPLANT
GOWN PREVENTION PLUS LG XLONG (DISPOSABLE) ×8 IMPLANT
GOWN STRL REIN XL XLG (GOWN DISPOSABLE) ×5 IMPLANT
HOLDER FOLEY CATH W/STRAP (MISCELLANEOUS) ×2 IMPLANT
IV LACTATED RINGERS 1000ML (IV SOLUTION) ×2 IMPLANT
KIT ACCESSORY DA VINCI DISP (KITS) ×1
KIT ACCESSORY DVNC DISP (KITS) ×1 IMPLANT
NDL SAFETY ECLIPSE 18X1.5 (NEEDLE) ×1 IMPLANT
NEEDLE HYPO 18GX1.5 SHARP (NEEDLE) ×2
PACK ROBOT UROLOGY CUSTOM (CUSTOM PROCEDURE TRAY) ×2 IMPLANT
RELOAD GREEN ECHELON 45 (STAPLE) ×2 IMPLANT
SEALER TISSUE G2 CVD JAW 45CM (ENDOMECHANICALS) ×2 IMPLANT
SET TUBE IRRIG SUCTION NO TIP (IRRIGATION / IRRIGATOR) ×2 IMPLANT
SOLUTION ELECTROLUBE (MISCELLANEOUS) ×2 IMPLANT
SPONGE GAUZE 2X2 STER 10/PKG (GAUZE/BANDAGES/DRESSINGS) ×1
SUT PDS AB 1 CT1 27 (SUTURE) ×1 IMPLANT
SUT VICRYL 0 UR6 27IN ABS (SUTURE) ×2 IMPLANT
SUT VLOC BARB 180 ABS3/0GR12 (SUTURE) ×4
SUTURE VLOC BRB 180 ABS3/0GR12 (SUTURE) IMPLANT
SYR 27GX1/2 1ML LL SAFETY (SYRINGE) ×2 IMPLANT
TOWEL OR NON WOVEN STRL DISP B (DISPOSABLE) ×2 IMPLANT
TROCAR 12M 150ML BLUNT (TROCAR) ×1 IMPLANT
WATER STERILE IRR 1500ML POUR (IV SOLUTION) ×4 IMPLANT

## 2013-09-24 NOTE — Progress Notes (Signed)
Gave patient admission kit, but he said that he cannot read [place]. Attempting to find patient care guide and other info in Spanish. Printed out vaccine information in Spanish to give to patient. Will continue to monitor patient.

## 2013-09-24 NOTE — Op Note (Signed)
Preoperative diagnosis:  1. Prostate cancer  Postoperative diagnosis:  1. same   Procedure: 1. Robotic assisted laparoscopic prostatectomy  Surgeon: Crist Fat, MD  Anesthesia: General  Complications: None  Intraoperative findings: large prostate  EBL: Minimal  Specimens: None  Indication: Karry T Nesmith is a 57 y.o. 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  After reviewing the management options for treatment, he elected to proceed with the above surgical procedure(s). We have discussed the potential benefits and risks of the procedure, side effects of the proposed treatment, the likelihood of the patient achieving the goals of the procedure, and any potential problems that might occur during the procedure or recuperation. Informed consent has been obtained.  Description of procedure:  The patient was consented in the preoperative holding area. He is in brought back to the operating room placed the table in supine position. General anesthesia was then induced and endotracheal tube was inserted. He was then placed in dorsolithotomy position and placed in steep Trendelenburg. He was then prepped and draped in the routine sterile fashion. We then began by making a 12 mm incision supraumbilical midline incision the skin. We then bluntly dissected down to the fascia and made a small incision in the fascia and then gently placed a 12 mm trocar and inserted the 0 robotic lens.  We then insufflated the abdomen to 15 mmHg.   We then placed 2 additional 8 millimeter trochars in the patient's left lower abdomen proximally 9 cm apart and 2 trochars on the patient's right lower abdomen, one was in a millimeter trocar and one most lateral was a 12 mm trocar which was used as the assistant port.  A 5 mm trocar was placed by triangulating the 2 right lateral ports as a second assistant port.  These ports were all placed under visual guidance. Once the ports  were noted to be satisfactory position the robot was docked. We started with the 0 lens, monopolar scissors in the right hand and the PK forceps in the left hand as well as a fenestrated grasper as the third arm on the left-hand side.  We began our dissection of the posterior plane incising the peritoneum at the level of the vas deferens. Isolated the left vas deferens and dissected it proximally towards the spermatic cord for 5 cm prior to ligating it. Then used this as traction to isolate the left the seminal vesicle which was then undressed bluntly, all vessels were cauterized with a combination of bipolar and the monopolar scissors. Once this had been dissected out laterally and posteriorly we turned our attention to the anterior plane and freed the the left seminal vesicle anteriorly from the surrounding tissues. We then turned our attention to the right side and similarly dissected out the right vas deferens in the right seminal vesicle. Once the SCDs had been freed we turned our attention to the posterior plane and bluntly dissected the tissue between the rectum and the posterior wall of the prostate bluntly out towards the apex.   At this point the bladder was taken down starting at the urachal remnant with a combination of both blunt dissection and sharp dissection with monopolar cautery the bladder was dropped down in the usual fashion to the medial umbilical ligaments laterally and the dorsal vein of the prostate anteriorly creating our space of Retzius. We then resected some of the peri-prostatic fat.  We then turned our attention to the endopelvic fascia which was incised laterally starting  on the patient's right-hand side, the levator muscles were pushed off the prostate laterally up towards the dorsal vein complex. This process was then repeated on the left-hand side and a nice notch was created for the dorsal vein which was stapled using the laparoscopic stapler. I then passed a 0  We then  located the bladder neck at the vesicoprostatic junction and the monopolar scissors dissected down through the perivesical tissues and the bladder neck down to the prostatic urethra.  The catheter was then deflated and pulled through our urethral opening and then used to retract the prostate anteriorly for the posterior bladder neck dissection. Once through the bladder neck and into the posterior plane of the prostate the SVs were brought through the opening. The left pedicle was then isolated and systematically taken with the enseal device.  This was then repeated on the right side  I then came down through the dorsal venous complex anteriorly down to the membranous urethra using the monopolar. Once down to the urethra the urethra was transected sharply and the apex of the prostate was then dissected off the levator and rectourethralis muscles. Once the apex of the prostate had been dissected free we came back to the base of the prostate and bluntly push the rectum and nerve vascular bundle off the prostate the patient's left and used clips on the patient's right to free the prostate. Once the prostate was free was placed in the Endo Catch bag and the string brought to the 5 mm port. The pelvis was then irrigated with normal saline and noted to be relatively hemostatic.   Using the 3-0 mono-cryl stitch was performed pulling the bladder neck down to the urethral stump. The vesicourethral anastomosis was then completed with 2 interlocking 3-0 V. lock sutures running the anastomosis in the 6:00 position to the 12:00 position on each side and then tying it off on the top. The final catheter was then passed through the patient's urethra and into the bladder and 120 cc was instilled into the bladder to test the anastomosis. As there was no leak a 57 [place] Blake drain was passed through the left lateral port and placed around the vesicourethral anastomosis. A 12 mm assistant port on the right lateral side was then  closed with 0 Vicryl with the help of the [employer] needle. The 12 mm midline infraumbilical incision was then extended another centimeter taken down and the fascia opened to remove the Endo Catch bag with the prostate specimen. The fascia was then closed with a 0 PDS.  The skin was closed with staples.  The drain was then secured to the skin with a 0 nylon stitch and dressing applied.   At the end of the case all laps needles and sponges had been accounted for. There no immediate complications.  The patient returned to the PACU in stable condition.   Crist Fat, M.D.

## 2013-09-24 NOTE — Anesthesia Preprocedure Evaluation (Addendum)
Anesthesia Evaluation  Patient identified by MRN, date of birth, ID band Patient awake    Reviewed: Allergy & Precautions, H&P , NPO status , Patient's Chart, lab work & pertinent test results, reviewed documented beta blocker date and time   Airway Mallampati: III TM Distance: >3 FB Neck ROM: full    Dental  (+) Caps and Dental Advisory Given 2 front upper capped:   Pulmonary neg pulmonary ROS, former smoker,  breath sounds clear to auscultation  Pulmonary exam normal       Cardiovascular Exercise Tolerance: Good hypertension, Pt. on medications and Pt. on home beta blockers Rhythm:regular Rate:Normal  RBBB   Neuro/Psych negative neurological ROS  negative psych ROS   GI/Hepatic negative GI ROS, Neg liver ROS,   Endo/Other  diabetes, Well Controlled, Type 2, Oral Hypoglycemic AgentsMorbid obesity  Renal/GU negative Renal ROS  negative genitourinary   Musculoskeletal   Abdominal (+) + obese,   Peds  Hematology negative hematology ROS (+)   Anesthesia Other Findings   Reproductive/Obstetrics negative OB ROS                         Anesthesia Physical Anesthesia Plan  ASA: III  Anesthesia Plan: General   Post-op Pain Management:    Induction: Intravenous  Airway Management Planned: Oral ETT  Additional Equipment:   Intra-op Plan:   Post-operative Plan: Extubation in OR  Informed Consent: I have reviewed the patients History and Physical, chart, labs and discussed the procedure including the risks, benefits and alternatives for the proposed anesthesia with the patient or authorized representative who has indicated his/her understanding and acceptance.   Dental Advisory Given  Plan Discussed with: CRNA and Surgeon  Anesthesia Plan Comments:         Anesthesia Quick Evaluation

## 2013-09-24 NOTE — Brief Op Note (Signed)
09/24/2013  6:09 PM  PATIENT:  Tyreon Coral Spikes  57 y.o. male  PRE-OPERATIVE DIAGNOSIS:  prostate cancer  POST-OPERATIVE DIAGNOSIS:  prostate cancer  PROCEDURE:  Procedure(s): ROBOTIC ASSISTED LAPAROSCOPIC RADICAL PROSTATECTOMY (N/A)  SURGEON:  Surgeon(s) and Role:    * Bjorn Pippin, MD - Primary    * Crist Fat, MD - Assisting  PHYSICIAN ASSISTANT:   ASSISTANTS: none   ANESTHESIA:   general  EBL:  Total I/O In: 800 [I.V.:800] Out: 200 [Blood:200]  BLOOD ADMINISTERED:none  DRAINS: (60fr) Jackson-Pratt drain(s) with closed bulb suction in the LLQ and Urinary Catheter (Foley)   LOCAL MEDICATIONS USED:  MARCAINE     SPECIMEN:  Source of Specimen:  prostate and SV's.   DISPOSITION OF SPECIMEN:  PATHOLOGY  COUNTS:  YES  TOURNIQUET:  * No tourniquets in log *  DICTATION: .Dragon Dictation  PLAN OF CARE: Admit to inpatient   PATIENT DISPOSITION:  PACU - hemodynamically stable.   Delay start of Pharmacological VTE agent (>24hrs) due to surgical blood loss or risk of bleeding: not applicable
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
## 2013-09-24 NOTE — Transfer of Care (Signed)
Immediate Anesthesia Transfer of Care Note  Patient: Jeremiah Baxter  Procedure(s) Performed: Procedure(s): ROBOTIC ASSISTED LAPAROSCOPIC RADICAL PROSTATECTOMY (N/A)  Patient Location: PACU  Anesthesia Type:General  Level of Consciousness: awake and sedated  Airway & Oxygen Therapy: Patient Spontanous Breathing and Patient connected to face mask oxygen  Post-op Assessment: Report given to PACU RN and Post -op Vital signs reviewed and stable  Post vital signs: stable  Complications: No apparent anesthesia complications

## 2013-09-24 NOTE — Anesthesia Postprocedure Evaluation (Signed)
  Anesthesia Post-op Note  Patient: Jeremiah Baxter  Procedure(s) Performed: Procedure(s) (LRB): ROBOTIC ASSISTED LAPAROSCOPIC RADICAL PROSTATECTOMY (N/A)  Patient Location: PACU  Anesthesia Type: General  Level of Consciousness: awake and alert   Airway and Oxygen Therapy: Patient Spontanous Breathing  Post-op Pain: mild  Post-op Assessment: Post-op Vital signs reviewed, Patient's Cardiovascular Status Stable, Respiratory Function Stable, Patent Airway and No signs of Nausea or vomiting  Last Vitals:  Filed Vitals:   09/24/13 1845  BP: 140/73  Pulse: 106  Temp:   Resp: 16    Post-op Vital Signs: stable   Complications: No apparent anesthesia complications

## 2013-09-24 NOTE — Preoperative (Signed)
Beta Blockers   Reason not to administer Beta Blockers:Not Applicable 

## 2013-09-25 ENCOUNTER — Encounter (HOSPITAL_COMMUNITY): Payer: Self-pay | Admitting: Urology

## 2013-09-25 DIAGNOSIS — G5600 Carpal tunnel syndrome, unspecified upper limb: Secondary | ICD-10-CM

## 2013-09-25 DIAGNOSIS — G5620 Lesion of ulnar nerve, unspecified upper limb: Secondary | ICD-10-CM

## 2013-09-25 LAB — BASIC METABOLIC PANEL
Calcium: 8.3 mg/dL — ABNORMAL LOW (ref 8.4–10.5)
Chloride: 101 mEq/L (ref 96–112)
GFR calc Af Amer: 49 mL/min — ABNORMAL LOW (ref >90)
Potassium: 5 mEq/L (ref 3.5–5.1)
Sodium: 135 mEq/L (ref 135–145)

## 2013-09-25 LAB — GLUCOSE, CAPILLARY: Glucose-Capillary: 133 mg/dL — ABNORMAL HIGH (ref 70–99)

## 2013-09-25 LAB — HEMOGLOBIN AND HEMATOCRIT, BLOOD
HCT: 33.4 % — ABNORMAL LOW (ref 39.0–52.0)
Hemoglobin: 11.6 g/dL — ABNORMAL LOW (ref 13.0–17.0)

## 2013-09-25 MED ORDER — WRIST SPLINT/COCK-UP/RIGHT L MISC: 1.0000 [IU] | Freq: Every day | Status: DC

## 2013-09-25 MED ORDER — ELBOW SUPPORT/PRESSURE PADS MISC: 1.0000 [IU] | Freq: Every day | Status: DC

## 2013-09-25 NOTE — Progress Notes (Signed)
Patient ID: [name], male   DOB: 07-09-1956, 57 y.o.   MRN: [id number]  [name] continues to complain of a sensory deficit in the 3rd-5th fingers on the left and the hand on the right with some weakness on the right and pain up into the right shoulder.  He also reports some pain over the medial elbow bilaterally in the area of the ulner nerve.   On exam he has good grip strength on the left and mild weakness on the right.  Imp: Post prostatectomy neuropraxy.   Plan:  I have consulted neurology for further evaluation and treatment recommendations.

## 2013-09-25 NOTE — Progress Notes (Signed)
Went in to check on patient, patient said he was having numbness and tingling in his right arm from shoulder to fingertips and numbness in his right leg as well. Patient could move his right arm and leg, but right grip was weak and patient was unable to wiggle the toes on his right foot. Patient's face was symmetrical and pupils were round, equal and reactive to light. No pronator drift was observed. Paged urology and spoke to PA on call who then had Laverle Patter, MD call me. Explained this info to [facility] who said that patient was most likely experiencing some nerve compression from surgery and to continue monitoring patient for changes. No new orders at this time. Was able to get patient up and he walked about 50 feet with two assist. Also irrgated patient's foley with 60 cc normal saline. No clots observed. Urine is yellow and clear. Will continue to monitor patient.  Leeroy Cha
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
## 2013-09-25 NOTE — Progress Notes (Signed)
Nutrition Brief Note  Patient identified on the Malnutrition Screening Tool (MST) Report  Wt Readings from Last 15 Encounters:  09/24/13 235 lb 14.3 oz (107 kg)  09/24/13 235 lb 14.3 oz (107 kg)  09/19/13 236 lb (107.049 kg)  08/11/13 239 lb (108.41 kg)    Body mass index is 39.25 kg/(m^2). Patient meets criteria for Obesity based on current BMI. Pt has had 2% wt loss (4 lbs) in the past 6 weeks- not significant.  Current diet order is Regular, patient is consuming approximately 100% of meals at this time. Labs and medications reviewed.   No nutrition interventions warranted at this time. If nutrition issues arise, please consult RD.   Ian Malkin RD, LDN Inpatient Clinical Dietitian Pager: [phone] After Hours Pager: [phone]
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
## 2013-09-26 DIAGNOSIS — G5620 Lesion of ulnar nerve, unspecified upper limb: Secondary | ICD-10-CM | POA: Diagnosis present

## 2013-09-26 DIAGNOSIS — G5600 Carpal tunnel syndrome, unspecified upper limb: Secondary | ICD-10-CM | POA: Diagnosis not present

## 2013-09-26 LAB — GLUCOSE, CAPILLARY: Glucose-Capillary: 226 mg/dL — ABNORMAL HIGH (ref 70–99)

## 2013-09-26 MED ORDER — ELBOW SUPPORT/PRESSURE PADS MISC: 1.0000 [IU] | Freq: Every day | Status: DC

## 2013-09-26 MED ORDER — OXYCODONE-ACETAMINOPHEN 5-325 MG PO TABS: 1.0000 | ORAL_TABLET | ORAL | Status: DC | PRN

## 2013-09-26 MED ORDER — CIPROFLOXACIN HCL 500 MG PO TABS: 500.0000 mg | ORAL_TABLET | Freq: Two times a day (BID) | ORAL | Status: DC

## 2013-09-26 MED ORDER — DSS 100 MG PO CAPS: 100.0000 mg | ORAL_CAPSULE | Freq: Two times a day (BID) | ORAL | Status: DC

## 2013-09-26 NOTE — Progress Notes (Signed)
Discharge to home, wife at bedside. Discharge instructions and follow up appointments done, incision and foley instructions and care done in the presence of the wife verbalized understanding. Spoke with Dr. Belva Crome nurse regarding patients ff up with neurology.
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
## 2013-09-26 NOTE — Progress Notes (Signed)
Spoke with Felicie Morn PA , was informed that they don't set up any   follow up appointment and doesn't have a link form outside neurology.Called Dr. Belva Crome office able to talked with his nurse ( MD is in [place] office), and told her about the need  For him  to make a neurology appointment for the patient.  .  Rn told the patient that Dr. Wilson Singer will set up  his appointment.

## 2013-10-01 ENCOUNTER — Emergency Department (HOSPITAL_COMMUNITY)
Admission: EM | Admit: 2013-10-01 | Discharge: 2013-10-02 | Disposition: A | Discharge status: Home / Self Care | Payer: Medicaid Other | Attending: Emergency Medicine | Admitting: Emergency Medicine

## 2013-10-01 ENCOUNTER — Encounter (HOSPITAL_COMMUNITY): Payer: Self-pay | Admitting: Emergency Medicine

## 2013-10-01 DIAGNOSIS — Z87828 Personal history of other (healed) physical injury and trauma: Secondary | ICD-10-CM | POA: Insufficient documentation

## 2013-10-01 DIAGNOSIS — Z87891 Personal history of nicotine dependence: Secondary | ICD-10-CM | POA: Insufficient documentation

## 2013-10-01 DIAGNOSIS — I10 Essential (primary) hypertension: Secondary | ICD-10-CM | POA: Insufficient documentation

## 2013-10-01 DIAGNOSIS — E119 Type 2 diabetes mellitus without complications: Secondary | ICD-10-CM | POA: Insufficient documentation

## 2013-10-01 DIAGNOSIS — Z8546 Personal history of malignant neoplasm of prostate: Secondary | ICD-10-CM | POA: Insufficient documentation

## 2013-10-01 DIAGNOSIS — R339 Retention of urine, unspecified: Secondary | ICD-10-CM

## 2013-10-01 DIAGNOSIS — R109 Unspecified abdominal pain: Secondary | ICD-10-CM | POA: Insufficient documentation

## 2013-10-01 DIAGNOSIS — E7800 Pure hypercholesterolemia, unspecified: Secondary | ICD-10-CM | POA: Insufficient documentation

## 2013-10-01 DIAGNOSIS — Z792 Long term (current) use of antibiotics: Secondary | ICD-10-CM | POA: Insufficient documentation

## 2013-10-01 DIAGNOSIS — Z8701 Personal history of pneumonia (recurrent): Secondary | ICD-10-CM | POA: Insufficient documentation

## 2013-10-01 DIAGNOSIS — Z9079 Acquired absence of other genital organ(s): Secondary | ICD-10-CM | POA: Insufficient documentation

## 2013-10-01 DIAGNOSIS — Z79899 Other long term (current) drug therapy: Secondary | ICD-10-CM | POA: Insufficient documentation

## 2013-10-01 NOTE — ED Notes (Signed)
Pt states he has not urinated since removal of catheter at 11am today, pt had prostatectomy 09/24/13, foley placed at that time.
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
## 2013-11-07 ENCOUNTER — Ambulatory Visit (INDEPENDENT_AMBULATORY_CARE_PROVIDER_SITE_OTHER): Payer: Medicaid Other | Admitting: Urology

## 2013-11-07 ENCOUNTER — Encounter: Payer: Self-pay | Admitting: Neurology

## 2013-11-07 ENCOUNTER — Ambulatory Visit: Payer: Self-pay | Admitting: Neurology

## 2013-11-07 DIAGNOSIS — C61 Malignant neoplasm of prostate: Secondary | ICD-10-CM

## 2013-11-07 DIAGNOSIS — N393 Stress incontinence (female) (male): Secondary | ICD-10-CM

## 2013-12-02 ENCOUNTER — Ambulatory Visit: Payer: Self-pay | Admitting: Neurology

## 2013-12-02 ENCOUNTER — Telehealth: Payer: Self-pay | Admitting: Neurology

## 2013-12-02 NOTE — Telephone Encounter (Signed)
Called patient to see if he was still coming in today, number was disconnected

## 2013-12-16 ENCOUNTER — Encounter (INDEPENDENT_AMBULATORY_CARE_PROVIDER_SITE_OTHER): Payer: Self-pay

## 2013-12-16 ENCOUNTER — Ambulatory Visit (INDEPENDENT_AMBULATORY_CARE_PROVIDER_SITE_OTHER): Payer: Medicaid Other | Admitting: Neurology

## 2013-12-16 ENCOUNTER — Encounter: Payer: Self-pay | Admitting: Neurology

## 2013-12-16 VITALS — BP 128/71 | HR 74 | Ht 65.0 in | Wt 230.0 lb

## 2013-12-16 DIAGNOSIS — Z87828 Personal history of other (healed) physical injury and trauma: Secondary | ICD-10-CM | POA: Insufficient documentation

## 2013-12-16 DIAGNOSIS — R202 Paresthesia of skin: Secondary | ICD-10-CM

## 2013-12-16 DIAGNOSIS — R209 Unspecified disturbances of skin sensation: Secondary | ICD-10-CM

## 2013-12-16 DIAGNOSIS — R269 Unspecified abnormalities of gait and mobility: Secondary | ICD-10-CM | POA: Insufficient documentation

## 2013-12-16 MED ORDER — DULOXETINE HCL 60 MG PO CPEP: 60.0000 mg | ORAL_CAPSULE | Freq: Every day | ORAL | Status: DC

## 2013-12-16 MED ORDER — GABAPENTIN 300 MG PO CAPS: 300.0000 mg | ORAL_CAPSULE | Freq: Three times a day (TID) | ORAL | Status: DC
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
## 2013-12-25 ENCOUNTER — Ambulatory Visit
Admission: RE | Admit: 2013-12-25 | Discharge: 2013-12-25 | Disposition: A | Discharge status: Home / Self Care | Payer: Medicaid Other | Source: Ambulatory Visit | Attending: Neurology | Admitting: Neurology

## 2013-12-25 DIAGNOSIS — R202 Paresthesia of skin: Secondary | ICD-10-CM

## 2013-12-25 DIAGNOSIS — R269 Unspecified abnormalities of gait and mobility: Secondary | ICD-10-CM

## 2013-12-25 DIAGNOSIS — Z87828 Personal history of other (healed) physical injury and trauma: Secondary | ICD-10-CM

## 2013-12-29 ENCOUNTER — Telehealth: Payer: Self-pay | Admitting: Neurology

## 2013-12-29 NOTE — Telephone Encounter (Signed)
Error

## 2014-01-01 ENCOUNTER — Encounter: Payer: Medicaid Other | Admitting: Neurology

## 2014-01-05 ENCOUNTER — Ambulatory Visit (INDEPENDENT_AMBULATORY_CARE_PROVIDER_SITE_OTHER): Payer: Medicaid Other | Admitting: Neurology

## 2014-01-05 ENCOUNTER — Encounter (INDEPENDENT_AMBULATORY_CARE_PROVIDER_SITE_OTHER): Payer: Self-pay

## 2014-01-05 DIAGNOSIS — G609 Hereditary and idiopathic neuropathy, unspecified: Secondary | ICD-10-CM

## 2014-01-05 DIAGNOSIS — G629 Polyneuropathy, unspecified: Secondary | ICD-10-CM | POA: Insufficient documentation

## 2014-01-05 DIAGNOSIS — R209 Unspecified disturbances of skin sensation: Secondary | ICD-10-CM

## 2014-01-05 DIAGNOSIS — R269 Unspecified abnormalities of gait and mobility: Secondary | ICD-10-CM

## 2014-01-05 DIAGNOSIS — G5600 Carpal tunnel syndrome, unspecified upper limb: Secondary | ICD-10-CM

## 2014-01-05 DIAGNOSIS — Z0289 Encounter for other administrative examinations: Secondary | ICD-10-CM

## 2014-01-05 DIAGNOSIS — Z87828 Personal history of other (healed) physical injury and trauma: Secondary | ICD-10-CM

## 2014-01-05 DIAGNOSIS — R202 Paresthesia of skin: Secondary | ICD-10-CM
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
## 2014-01-14 ENCOUNTER — Telehealth: Payer: Self-pay | Admitting: Neurology

## 2014-01-14 NOTE — Telephone Encounter (Signed)
For WID, Dr. Krista Blue ordered a LP for patient, but [facility] called and the patient needs to have a MRI brain first.  Is it all right to order the MRI brain?

## 2014-01-14 NOTE — Telephone Encounter (Signed)
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

## 2014-01-20 ENCOUNTER — Ambulatory Visit
Admission: RE | Admit: 2014-01-20 | Discharge: 2014-01-20 | Disposition: A | Discharge status: Home / Self Care | Payer: Medicaid Other | Source: Ambulatory Visit | Attending: Neurology | Admitting: Neurology

## 2014-01-20 VITALS — BP 140/62 | HR 83

## 2014-01-20 DIAGNOSIS — G609 Hereditary and idiopathic neuropathy, unspecified: Secondary | ICD-10-CM

## 2014-01-20 DIAGNOSIS — G5600 Carpal tunnel syndrome, unspecified upper limb: Secondary | ICD-10-CM

## 2014-01-20 DIAGNOSIS — R202 Paresthesia of skin: Secondary | ICD-10-CM

## 2014-01-20 DIAGNOSIS — G5620 Lesion of ulnar nerve, unspecified upper limb: Secondary | ICD-10-CM

## 2014-01-20 DIAGNOSIS — R269 Unspecified abnormalities of gait and mobility: Secondary | ICD-10-CM

## 2014-01-20 LAB — GRAM STAIN: Gram Stain: NONE SEEN

## 2014-01-20 NOTE — Discharge Instructions (Signed)

## 2014-01-22 LAB — CSF PANEL 1
Glucose, CSF: 82 mg/dL — ABNORMAL HIGH (ref 43–76)
RBC COUNT CSF: 0 uL
Total Protein, CSF: 28 mg/dL (ref 15–45)
Tube #: 4
VDRL Quant, CSF: NONREACTIVE
WBC, CSF: 0 cu mm (ref 0–5)

## 2014-02-13 ENCOUNTER — Telehealth: Payer: Self-pay | Admitting: Neurology

## 2014-02-13 NOTE — Telephone Encounter (Signed)
Tameka from [place] on [address]. Calling to state that a prior authorization for patient's Duloxetine was faxed 3 times to [place] but they haven't heard anything back yet. Please call and advise.

## 2014-02-13 NOTE — Telephone Encounter (Signed)
Cymbalta is formulary, and covered by the patients ins, no PA required.  I called back.  Spoke with the pharmacist.  He said the Rx went through without any issues, the person who called was not processing the Rx correctly.

## 2014-02-15 LAB — FUNGUS CULTURE W SMEAR: SMEAR RESULT: NONE SEEN

## 2014-08-03 ENCOUNTER — Encounter: Payer: Self-pay | Admitting: Neurology

## 2014-08-03 ENCOUNTER — Ambulatory Visit (INDEPENDENT_AMBULATORY_CARE_PROVIDER_SITE_OTHER): Payer: Medicaid Other | Admitting: Neurology

## 2014-08-03 ENCOUNTER — Encounter (INDEPENDENT_AMBULATORY_CARE_PROVIDER_SITE_OTHER): Payer: Self-pay

## 2014-08-03 VITALS — BP 133/74 | HR 66 | Ht 65.0 in | Wt 240.0 lb

## 2014-08-03 DIAGNOSIS — R202 Paresthesia of skin: Secondary | ICD-10-CM

## 2014-08-03 DIAGNOSIS — G5600 Carpal tunnel syndrome, unspecified upper limb: Secondary | ICD-10-CM

## 2014-08-03 DIAGNOSIS — G629 Polyneuropathy, unspecified: Secondary | ICD-10-CM

## 2014-08-03 DIAGNOSIS — G5620 Lesion of ulnar nerve, unspecified upper limb: Secondary | ICD-10-CM

## 2014-08-03 DIAGNOSIS — R269 Unspecified abnormalities of gait and mobility: Secondary | ICD-10-CM
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
## 2014-08-20 ENCOUNTER — Ambulatory Visit (INDEPENDENT_AMBULATORY_CARE_PROVIDER_SITE_OTHER): Payer: Medicaid Other | Admitting: Neurology

## 2014-08-20 ENCOUNTER — Encounter: Payer: Self-pay | Admitting: Neurology

## 2014-08-20 VITALS — BP 143/77 | HR 92 | Ht 65.0 in | Wt 245.0 lb

## 2014-08-20 DIAGNOSIS — C61 Malignant neoplasm of prostate: Secondary | ICD-10-CM

## 2014-08-20 DIAGNOSIS — G629 Polyneuropathy, unspecified: Secondary | ICD-10-CM

## 2014-08-20 DIAGNOSIS — Z87828 Personal history of other (healed) physical injury and trauma: Secondary | ICD-10-CM

## 2014-08-20 DIAGNOSIS — G5620 Lesion of ulnar nerve, unspecified upper limb: Secondary | ICD-10-CM

## 2014-08-20 MED ORDER — MELOXICAM 7.5 MG PO TABS: 7.5000 mg | ORAL_TABLET | Freq: Every day | ORAL | Status: DC
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
## 2014-08-31 ENCOUNTER — Other Ambulatory Visit: Payer: Medicaid Other

## 2014-09-24 ENCOUNTER — Encounter: Payer: Self-pay | Admitting: *Deleted

## 2014-09-24 ENCOUNTER — Ambulatory Visit: Payer: Medicaid Other | Admitting: Neurology

## 2014-10-29 ENCOUNTER — Encounter (HOSPITAL_COMMUNITY): Payer: Self-pay | Admitting: Urology

## 2015-02-22 ENCOUNTER — Other Ambulatory Visit: Payer: Self-pay | Admitting: Neurology

## 2015-02-22 NOTE — Telephone Encounter (Signed)
No showed last appt  

## 2022-02-23 ENCOUNTER — Emergency Department (HOSPITAL_COMMUNITY): Payer: Medicare Other

## 2022-02-23 ENCOUNTER — Encounter (HOSPITAL_COMMUNITY): Payer: Self-pay | Admitting: Emergency Medicine

## 2022-02-23 ENCOUNTER — Inpatient Hospital Stay (HOSPITAL_COMMUNITY)
Admission: EM | Admit: 2022-02-23 | Discharge: 2022-03-02 | DRG: 481 | Disposition: A | Discharge status: Home / Self Care | Payer: Medicare Other | Attending: Student | Admitting: Student

## 2022-02-23 ENCOUNTER — Other Ambulatory Visit: Payer: Self-pay

## 2022-02-23 DIAGNOSIS — Z833 Family history of diabetes mellitus: Secondary | ICD-10-CM | POA: Diagnosis not present

## 2022-02-23 DIAGNOSIS — M25511 Pain in right shoulder: Secondary | ICD-10-CM | POA: Diagnosis present

## 2022-02-23 DIAGNOSIS — M21751 Unequal limb length (acquired), right femur: Secondary | ICD-10-CM | POA: Diagnosis present

## 2022-02-23 DIAGNOSIS — E1165 Type 2 diabetes mellitus with hyperglycemia: Secondary | ICD-10-CM | POA: Diagnosis present

## 2022-02-23 DIAGNOSIS — Z6841 Body Mass Index (BMI) 40.0 and over, adult: Secondary | ICD-10-CM | POA: Diagnosis not present

## 2022-02-23 DIAGNOSIS — S92001P Unspecified fracture of right calcaneus, subsequent encounter for fracture with malunion: Secondary | ICD-10-CM | POA: Diagnosis not present

## 2022-02-23 DIAGNOSIS — E7800 Pure hypercholesterolemia, unspecified: Secondary | ICD-10-CM | POA: Diagnosis present

## 2022-02-23 DIAGNOSIS — Z79899 Other long term (current) drug therapy: Secondary | ICD-10-CM | POA: Diagnosis not present

## 2022-02-23 DIAGNOSIS — Y92410 Unspecified street and highway as the place of occurrence of the external cause: Secondary | ICD-10-CM

## 2022-02-23 DIAGNOSIS — I10 Essential (primary) hypertension: Secondary | ICD-10-CM | POA: Diagnosis present

## 2022-02-23 DIAGNOSIS — S72401A Unspecified fracture of lower end of right femur, initial encounter for closed fracture: Secondary | ICD-10-CM | POA: Diagnosis present

## 2022-02-23 DIAGNOSIS — Z7984 Long term (current) use of oral hypoglycemic drugs: Secondary | ICD-10-CM | POA: Diagnosis not present

## 2022-02-23 DIAGNOSIS — Z87891 Personal history of nicotine dependence: Secondary | ICD-10-CM

## 2022-02-23 DIAGNOSIS — M79661 Pain in right lower leg: Secondary | ICD-10-CM | POA: Diagnosis not present

## 2022-02-23 DIAGNOSIS — Z8249 Family history of ischemic heart disease and other diseases of the circulatory system: Secondary | ICD-10-CM | POA: Diagnosis not present

## 2022-02-23 DIAGNOSIS — Z8781 Personal history of (healed) traumatic fracture: Secondary | ICD-10-CM

## 2022-02-23 DIAGNOSIS — S72431A Displaced fracture of medial condyle of right femur, initial encounter for closed fracture: Secondary | ICD-10-CM | POA: Diagnosis present

## 2022-02-23 DIAGNOSIS — M1712 Unilateral primary osteoarthritis, left knee: Secondary | ICD-10-CM | POA: Diagnosis present

## 2022-02-23 DIAGNOSIS — D62 Acute posthemorrhagic anemia: Secondary | ICD-10-CM | POA: Diagnosis not present

## 2022-02-23 DIAGNOSIS — S72301K Unspecified fracture of shaft of right femur, subsequent encounter for closed fracture with nonunion: Secondary | ICD-10-CM | POA: Diagnosis not present

## 2022-02-23 DIAGNOSIS — S82141A Displaced bicondylar fracture of right tibia, initial encounter for closed fracture: Secondary | ICD-10-CM | POA: Diagnosis present

## 2022-02-23 DIAGNOSIS — E119 Type 2 diabetes mellitus without complications: Secondary | ICD-10-CM | POA: Diagnosis not present

## 2022-02-23 LAB — CBC WITH DIFFERENTIAL/PLATELET
Abs Immature Granulocytes: 0.05 10*3/uL (ref 0.00–0.07)
Basophils Absolute: 0.1 10*3/uL (ref 0.0–0.1)
Basophils Relative: 1 %
Eosinophils Absolute: 0.2 10*3/uL (ref 0.0–0.5)
Eosinophils Relative: 3 %
HCT: 39.3 % (ref 39.0–52.0)
Hemoglobin: 14.3 g/dL (ref 13.0–17.0)
Immature Granulocytes: 1 %
Lymphocytes Relative: 23 %
Lymphs Abs: 2.1 10*3/uL (ref 0.7–4.0)
MCH: 31.1 pg (ref 26.0–34.0)
MCHC: 36.4 g/dL — ABNORMAL HIGH (ref 30.0–36.0)
MCV: 85.4 fL (ref 80.0–100.0)
Monocytes Absolute: 0.5 10*3/uL (ref 0.1–1.0)
Monocytes Relative: 5 %
Neutro Abs: 6.2 10*3/uL (ref 1.7–7.7)
Neutrophils Relative %: 67 %
Platelets: 253 10*3/uL (ref 150–400)
RBC: 4.6 MIL/uL (ref 4.22–5.81)
RDW: 12.1 % (ref 11.5–15.5)
WBC: 9.2 10*3/uL (ref 4.0–10.5)
nRBC: 0 % (ref 0.0–0.2)

## 2022-02-23 LAB — COMPREHENSIVE METABOLIC PANEL
ALT: 15 U/L (ref 0–44)
AST: 29 U/L (ref 15–41)
Albumin: 3.9 g/dL (ref 3.5–5.0)
Alkaline Phosphatase: 73 U/L (ref 38–126)
Anion gap: 10 (ref 5–15)
BUN: 18 mg/dL (ref 8–23)
CO2: 24 mmol/L (ref 22–32)
Calcium: 9.3 mg/dL (ref 8.9–10.3)
Chloride: 103 mmol/L (ref 98–111)
Creatinine, Ser: 1.18 mg/dL (ref 0.61–1.24)
GFR, Estimated: >60 mL/min (ref >60)
Glucose, Bld: 195 mg/dL — ABNORMAL HIGH (ref 70–99)
Potassium: 4.5 mmol/L (ref 3.5–5.1)
Sodium: 137 mmol/L (ref 135–145)
Total Bilirubin: 0.8 mg/dL (ref 0.3–1.2)
Total Protein: 6.7 g/dL (ref 6.5–8.1)

## 2022-02-23 LAB — HIV ANTIBODY (ROUTINE TESTING W REFLEX): HIV Screen 4th Generation wRfx: NONREACTIVE

## 2022-02-23 MED ORDER — ONDANSETRON HCL 4 MG/2ML IJ SOLN
4.0000 mg | Freq: Four times a day (QID) | INTRAMUSCULAR | Status: DC | PRN
Start: 2022-02-23 — End: 2022-03-02

## 2022-02-23 MED ORDER — MORPHINE SULFATE (PF) 2 MG/ML IV SOLN
2.0000 mg | INTRAVENOUS | Status: DC | PRN
  Administered 2022-02-23 (×2): 2 mg via INTRAVENOUS
  Filled 2022-02-23 (×2): qty 1

## 2022-02-23 MED ORDER — ENOXAPARIN SODIUM 40 MG/0.4ML IJ SOSY
40.0000 mg | PREFILLED_SYRINGE | INTRAMUSCULAR | Status: DC
  Administered 2022-02-23 – 2022-03-02 (×8): 40 mg via SUBCUTANEOUS
  Filled 2022-02-23 (×8): qty 0.4

## 2022-02-23 MED ORDER — OXYCODONE HCL 5 MG PO TABS
5.0000 mg | ORAL_TABLET | ORAL | Status: DC | PRN
  Administered 2022-02-23 – 2022-03-01 (×8): 15 mg via ORAL
  Filled 2022-02-23 (×9): qty 3

## 2022-02-23 MED ORDER — METHOCARBAMOL 1000 MG/10ML IJ SOLN
500.0000 mg | Freq: Four times a day (QID) | INTRAVENOUS | Status: DC | PRN
  Filled 2022-02-23 (×2): qty 5

## 2022-02-23 MED ORDER — METHOCARBAMOL 500 MG PO TABS
500.0000 mg | ORAL_TABLET | Freq: Four times a day (QID) | ORAL | Status: DC | PRN
  Administered 2022-02-24: 500 mg via ORAL
  Filled 2022-02-23: qty 1

## 2022-02-23 MED ORDER — MORPHINE SULFATE (PF) 4 MG/ML IV SOLN
4.0000 mg | Freq: Once | INTRAVENOUS | Status: AC
  Administered 2022-02-23: 4 mg via INTRAVENOUS
  Filled 2022-02-23: qty 1

## 2022-02-23 MED ORDER — ONDANSETRON HCL 4 MG PO TABS: 4.0000 mg | ORAL_TABLET | Freq: Four times a day (QID) | ORAL | Status: DC | PRN

## 2022-02-23 MED ORDER — IOHEXOL 300 MG/ML  SOLN
100.0000 mL | Freq: Once | INTRAMUSCULAR | Status: AC | PRN
  Administered 2022-02-23: 100 mL via INTRAVENOUS
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
## 2022-02-23 NOTE — Progress Notes (Signed)
Orthopedic Tech Progress Note ?Patient Details:  ?Jeremiah Baxter ?July 29, 1956 ?[id number] ? ? ?Ortho Devices ?Type of Ortho Device: Knee Immobilizer ?Ortho Device/Splint Location: RLE ?Ortho Device/Splint Interventions: Ordered, Application, Adjustment ?  ?Post Interventions ?Patient Tolerated: Fair ?Instructions Provided: Care of device, Adjustment of device ? ?Byron Peacock Jeri Modena ?02/23/2022, 5:37 PM ? ?
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
## 2022-02-24 ENCOUNTER — Inpatient Hospital Stay (HOSPITAL_COMMUNITY): Payer: Medicare Other

## 2022-02-24 LAB — HEMOGLOBIN A1C
Hgb A1c MFr Bld: 8.7 % — ABNORMAL HIGH (ref 4.8–5.6)
Mean Plasma Glucose: 202.99 mg/dL

## 2022-02-24 LAB — GLUCOSE, CAPILLARY
Glucose-Capillary: 225 mg/dL — ABNORMAL HIGH (ref 70–99)
Glucose-Capillary: 190 mg/dL — ABNORMAL HIGH (ref 70–99)
Glucose-Capillary: 211 mg/dL — ABNORMAL HIGH (ref 70–99)

## 2022-02-24 LAB — VITAMIN D 25 HYDROXY (VIT D DEFICIENCY, FRACTURES): Vit D, 25-Hydroxy: 21.46 ng/mL — ABNORMAL LOW (ref 30–100)

## 2022-02-24 MED ORDER — LORATADINE 10 MG PO TABS: 10.0000 mg | ORAL_TABLET | Freq: Every day | ORAL | Status: DC | PRN

## 2022-02-24 MED ORDER — METHOCARBAMOL 500 MG PO TABS
1000.0000 mg | ORAL_TABLET | Freq: Three times a day (TID) | ORAL | Status: DC
Start: 2022-02-24 — End: 2022-03-02
  Administered 2022-02-24 – 2022-03-02 (×18): 1000 mg via ORAL
  Filled 2022-02-24 (×18): qty 2

## 2022-02-24 MED ORDER — ZINC SULFATE 220 (50 ZN) MG PO CAPS
220.0000 mg | ORAL_CAPSULE | Freq: Every day | ORAL | Status: DC
  Administered 2022-02-24 – 2022-03-02 (×7): 220 mg via ORAL
  Filled 2022-02-24 (×7): qty 1

## 2022-02-24 MED ORDER — LIP MEDEX EX OINT
1.0000 "application " | TOPICAL_OINTMENT | CUTANEOUS | Status: DC | PRN
  Filled 2022-02-24: qty 7

## 2022-02-24 MED ORDER — KETOROLAC TROMETHAMINE 15 MG/ML IJ SOLN
15.0000 mg | Freq: Three times a day (TID) | INTRAMUSCULAR | Status: AC
  Administered 2022-02-24 – 2022-02-27 (×8): 15 mg via INTRAVENOUS
  Filled 2022-02-24 (×9): qty 1

## 2022-02-24 MED ORDER — ACETAMINOPHEN 500 MG PO TABS
1000.0000 mg | ORAL_TABLET | Freq: Three times a day (TID) | ORAL | Status: DC
  Administered 2022-02-24 – 2022-03-02 (×14): 1000 mg via ORAL
  Filled 2022-02-24 (×14): qty 2

## 2022-02-24 MED ORDER — AMLODIPINE BESYLATE 10 MG PO TABS
10.0000 mg | ORAL_TABLET | Freq: Every morning | ORAL | Status: DC
  Administered 2022-02-24 – 2022-03-02 (×7): 10 mg via ORAL
  Filled 2022-02-24 (×7): qty 1

## 2022-02-24 MED ORDER — NAPHAZOLINE-GLYCERIN 0.012-0.2 % OP SOLN: 1.0000 [drp] | Freq: Four times a day (QID) | OPHTHALMIC | Status: DC | PRN

## 2022-02-24 MED ORDER — LISINOPRIL-HYDROCHLOROTHIAZIDE 20-25 MG PO TABS: 1.0000 | ORAL_TABLET | Freq: Every morning | ORAL | Status: DC

## 2022-02-24 MED ORDER — ALUM & MAG HYDROXIDE-SIMETH 200-200-20 MG/5ML PO SUSP: 30.0000 mL | ORAL | Status: DC | PRN

## 2022-02-24 MED ORDER — ASCORBIC ACID 500 MG PO TABS
500.0000 mg | ORAL_TABLET | Freq: Every day | ORAL | Status: DC
  Administered 2022-02-24 – 2022-03-02 (×7): 500 mg via ORAL
  Filled 2022-02-24 (×7): qty 1

## 2022-02-24 MED ORDER — ALLOPURINOL 100 MG PO TABS
100.0000 mg | ORAL_TABLET | Freq: Every morning | ORAL | Status: DC
  Administered 2022-02-24 – 2022-03-02 (×6): 100 mg via ORAL
  Filled 2022-02-24 (×6): qty 1

## 2022-02-24 MED ORDER — HYDROCHLOROTHIAZIDE 25 MG PO TABS
25.0000 mg | ORAL_TABLET | Freq: Every day | ORAL | Status: DC
  Administered 2022-02-24 – 2022-03-02 (×7): 25 mg via ORAL
  Filled 2022-02-24 (×7): qty 1

## 2022-02-24 MED ORDER — METHOCARBAMOL 1000 MG/10ML IJ SOLN: 500.0000 mg | Freq: Three times a day (TID) | INTRAVENOUS | Status: DC

## 2022-02-24 MED ORDER — VITAMIN D 25 MCG (1000 UNIT) PO TABS
2000.0000 [IU] | ORAL_TABLET | Freq: Two times a day (BID) | ORAL | Status: DC
  Administered 2022-02-24 – 2022-03-02 (×12): 2000 [IU] via ORAL
  Filled 2022-02-24 (×12): qty 2

## 2022-02-24 MED ORDER — LISINOPRIL 20 MG PO TABS
20.0000 mg | ORAL_TABLET | Freq: Every day | ORAL | Status: DC
  Administered 2022-02-24 – 2022-03-02 (×7): 20 mg via ORAL
  Filled 2022-02-24 (×7): qty 1

## 2022-02-24 MED ORDER — INSULIN ASPART 100 UNIT/ML IJ SOLN
0.0000 [IU] | Freq: Three times a day (TID) | INTRAMUSCULAR | Status: DC
  Administered 2022-02-24 (×2): 5 [IU] via SUBCUTANEOUS
  Administered 2022-02-25: 3 [IU] via SUBCUTANEOUS
  Administered 2022-02-25 (×2): 5 [IU] via SUBCUTANEOUS
  Administered 2022-02-26: 3 [IU] via SUBCUTANEOUS
  Administered 2022-02-26: 11 [IU] via SUBCUTANEOUS
  Administered 2022-02-26: 3 [IU] via SUBCUTANEOUS
  Administered 2022-02-27 – 2022-02-28 (×2): 11 [IU] via SUBCUTANEOUS
  Administered 2022-02-28: 5 [IU] via SUBCUTANEOUS
  Administered 2022-02-28: 8 [IU] via SUBCUTANEOUS
  Administered 2022-03-01: 2 [IU] via SUBCUTANEOUS
  Administered 2022-03-01: 8 [IU] via SUBCUTANEOUS
  Administered 2022-03-02: 5 [IU] via SUBCUTANEOUS
  Administered 2022-03-02 (×2): 3 [IU] via SUBCUTANEOUS
  Filled 2022-02-24: qty 1

## 2022-02-24 MED ORDER — SALINE SPRAY 0.65 % NA SOLN
1.0000 | NASAL | Status: DC | PRN
  Filled 2022-02-24: qty 44

## 2022-02-24 MED ORDER — GABAPENTIN 300 MG PO CAPS
300.0000 mg | ORAL_CAPSULE | Freq: Three times a day (TID) | ORAL | Status: DC
  Administered 2022-02-24 – 2022-03-02 (×19): 300 mg via ORAL
  Filled 2022-02-24 (×19): qty 1

## 2022-02-24 MED ORDER — POLYVINYL ALCOHOL 1.4 % OP SOLN
2.0000 [drp] | OPHTHALMIC | Status: DC | PRN
  Filled 2022-02-24: qty 15

## 2022-02-24 MED ORDER — NAPHAZOLINE-PHENIRAMINE 0.025-0.3 % OP SOLN
1.0000 [drp] | Freq: Four times a day (QID) | OPHTHALMIC | Status: DC | PRN
  Filled 2022-02-24: qty 5
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
## 2022-02-24 NOTE — Plan of Care (Signed)
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
## 2022-02-24 NOTE — Progress Notes (Signed)
Inpatient Diabetes Program Recommendations ? ?AACE/ADA: New Consensus Statement on Inpatient Glycemic Control (2015) ? ?Target Ranges:  Prepandial:   less than 140 mg/dL ?     Peak postprandial:   less than 180 mg/dL (1-2 hours) ?     Critically ill patients:  140 - 180 mg/dL  ? ?Lab Results  ?Component Value Date  ? GLUCAP 225 (H) 02/24/2022  ? HGBA1C 8.7 (H) 02/23/2022  ? ? ?Review of Glycemic Control ? ?Diabetes history: type 2 ?Outpatient Diabetes medications: Amaryl 2 mg daily, Metformin 500 mg TID (not sure if taking) ?Current orders for Inpatient glycemic control: Novolog correction scale 0-15 units TID ? ?Inpatient Diabetes Program Recommendations:   ?Received diabetes coordinator consult. Agree with current insulin orders for Novolog correction scale 0-15 units TID. Will continue to monitor blood sugars while in the hospital and following surgery.  ? ?Harvel Ricks RN BSN CDE ?Diabetes Coordinator ?Pager: [phone]  8am-5pm  ? ? ? ?

## 2022-02-24 NOTE — TOC CAGE-AID Note (Signed)
Transition of Care (TOC) - CAGE-AID Screening ? ? ?Patient Details  ?Name: Jeremiah Baxter ?MRN: [id number] ?Date of Birth: 01-29-1956 ? ?Transition of Care (TOC) CM/SW Contact:    ?Maxon Kresse C Tarpley-Carter, LCSWA ?Phone Number: ?02/24/2022, 10:59 AM ? ? ?Clinical Narrative: ?Pt participated in [employer].  Pt stated he does not use substance or ETOH.  Pt was not offered resources, due to no usage of substance or ETOH.    ? ?Passenger transport manager, MSW, LCSW-A ?Pronouns:  She/Her/Hers ?[facility] ?Clinical Social Worker ?Direct Number:  [phone] ?[email or web address] ? ?CAGE-AID Screening: ?  ? ?Have You Ever Felt You Ought to Cut Down on Your Drinking or Drug Use?: No ?Have People Annoyed You By Critizing Your Drinking Or Drug Use?: No ?Have You Felt Bad Or Guilty About Your Drinking Or Drug Use?: No ?Have You Ever Had a Drink or Used Drugs First Thing In The Morning to Steady Your Nerves or to Get Rid of a Hangover?: No ?CAGE-AID Score: 0 ? ?Substance Abuse Education Offered: No ? ?  ? ? ? ? ? ? ?
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
## 2022-02-25 ENCOUNTER — Inpatient Hospital Stay (HOSPITAL_COMMUNITY): Payer: Medicare Other

## 2022-02-25 LAB — GLUCOSE, CAPILLARY
Glucose-Capillary: 181 mg/dL — ABNORMAL HIGH (ref 70–99)
Glucose-Capillary: 204 mg/dL — ABNORMAL HIGH (ref 70–99)
Glucose-Capillary: 223 mg/dL — ABNORMAL HIGH (ref 70–99)
Glucose-Capillary: 198 mg/dL — ABNORMAL HIGH (ref 70–99)
Glucose-Capillary: 180 mg/dL — ABNORMAL HIGH (ref 70–99)

## 2022-02-25 LAB — CBC
HCT: 30 % — ABNORMAL LOW (ref 39.0–52.0)
Hemoglobin: 10.4 g/dL — ABNORMAL LOW (ref 13.0–17.0)
MCH: 29.9 pg (ref 26.0–34.0)
MCHC: 34.7 g/dL (ref 30.0–36.0)
MCV: 86.2 fL (ref 80.0–100.0)
Platelets: 192 10*3/uL (ref 150–400)
RBC: 3.48 MIL/uL — ABNORMAL LOW (ref 4.22–5.81)
RDW: 12.1 % (ref 11.5–15.5)
WBC: 7.7 10*3/uL (ref 4.0–10.5)
nRBC: 0 % (ref 0.0–0.2)

## 2022-02-25 MED ORDER — INSULIN GLARGINE-YFGN 100 UNIT/ML ~~LOC~~ SOLN
12.0000 [IU] | Freq: Every day | SUBCUTANEOUS | Status: DC
  Administered 2022-02-25 – 2022-03-01 (×5): 12 [IU] via SUBCUTANEOUS
  Filled 2022-02-25 (×7): qty 0.12
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
## 2022-02-25 NOTE — Progress Notes (Signed)
Inpatient Diabetes Program Recommendations ? ?AACE/ADA: New Consensus Statement on Inpatient Glycemic Control (2015) ? ?Target Ranges:  Prepandial:   less than 140 mg/dL ?     Peak postprandial:   less than 180 mg/dL (1-2 hours) ?     Critically ill patients:  140 - 180 mg/dL  ? ?Lab Results  ?Component Value Date  ? GLUCAP 223 (H) 02/25/2022  ? HGBA1C 8.7 (H) 02/23/2022  ? ? ?Review of Glycemic Control ? ?Diabetes history: DM2 ?Outpatient Diabetes medications: metformin 500 mg TID ?Current orders for Inpatient glycemic control: Novolog 0-15 units TID ? ?HgbA1C - 8.7% ? ?Inpatient Diabetes Program Recommendations:   ? ?Add Semglee 12 units QHS ? ?Continue to follow. ? ?Thank you. ?Lorenda Peck, RD, LDN, CDE ?Inpatient Diabetes Coordinator ?[phone]  ? ? ? ? ? ? ? ?

## 2022-02-25 NOTE — Plan of Care (Signed)
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
## 2022-02-25 NOTE — H&P (Signed)
Please see consult note from Hilbert Odor, PA-C on 5/11 for full orthopaedic H&P ?

## 2022-02-26 LAB — GLUCOSE, CAPILLARY
Glucose-Capillary: 152 mg/dL — ABNORMAL HIGH (ref 70–99)
Glucose-Capillary: 303 mg/dL — ABNORMAL HIGH (ref 70–99)
Glucose-Capillary: 179 mg/dL — ABNORMAL HIGH (ref 70–99)
Glucose-Capillary: 250 mg/dL — ABNORMAL HIGH (ref 70–99)
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
## 2022-02-27 ENCOUNTER — Inpatient Hospital Stay (HOSPITAL_COMMUNITY): Payer: Medicare Other

## 2022-02-27 ENCOUNTER — Inpatient Hospital Stay (HOSPITAL_COMMUNITY): Payer: Medicare Other | Admitting: Certified Registered Nurse Anesthetist

## 2022-02-27 ENCOUNTER — Other Ambulatory Visit: Payer: Self-pay

## 2022-02-27 ENCOUNTER — Encounter (HOSPITAL_COMMUNITY): Payer: Self-pay | Admitting: Student

## 2022-02-27 ENCOUNTER — Encounter (HOSPITAL_COMMUNITY)
Admission: EM | Disposition: A | Discharge status: Home / Self Care | Payer: Self-pay | Source: Home / Self Care | Attending: Student

## 2022-02-27 DIAGNOSIS — S72301K Unspecified fracture of shaft of right femur, subsequent encounter for closed fracture with nonunion: Secondary | ICD-10-CM

## 2022-02-27 DIAGNOSIS — S72401A Unspecified fracture of lower end of right femur, initial encounter for closed fracture: Secondary | ICD-10-CM

## 2022-02-27 DIAGNOSIS — E119 Type 2 diabetes mellitus without complications: Secondary | ICD-10-CM

## 2022-02-27 DIAGNOSIS — I10 Essential (primary) hypertension: Secondary | ICD-10-CM

## 2022-02-27 HISTORY — PX: ORIF FEMUR FRACTURE: SHX2119

## 2022-02-27 HISTORY — PX: HARDWARE REMOVAL: SHX979

## 2022-02-27 LAB — GLUCOSE, CAPILLARY
Glucose-Capillary: 155 mg/dL — ABNORMAL HIGH (ref 70–99)
Glucose-Capillary: 172 mg/dL — ABNORMAL HIGH (ref 70–99)
Glucose-Capillary: 151 mg/dL — ABNORMAL HIGH (ref 70–99)
Glucose-Capillary: 217 mg/dL — ABNORMAL HIGH (ref 70–99)
Glucose-Capillary: 237 mg/dL — ABNORMAL HIGH (ref 70–99)
Glucose-Capillary: 326 mg/dL — ABNORMAL HIGH (ref 70–99)
Glucose-Capillary: 365 mg/dL — ABNORMAL HIGH (ref 70–99)

## 2022-02-27 SURGERY — OPEN REDUCTION INTERNAL FIXATION (ORIF) DISTAL FEMUR FRACTURE
Anesthesia: General | Site: Leg Upper | Laterality: Right

## 2022-02-27 MED ORDER — MIDAZOLAM HCL 2 MG/2ML IJ SOLN
INTRAMUSCULAR | Status: DC | PRN
  Administered 2022-02-27: 2 mg via INTRAVENOUS

## 2022-02-27 MED ORDER — ONDANSETRON HCL 4 MG/2ML IJ SOLN: 4.0000 mg | Freq: Once | INTRAMUSCULAR | Status: DC | PRN

## 2022-02-27 MED ORDER — HYDROMORPHONE HCL 1 MG/ML IJ SOLN
INTRAMUSCULAR | Status: AC
  Filled 2022-02-27: qty 1

## 2022-02-27 MED ORDER — ROCURONIUM BROMIDE 10 MG/ML (PF) SYRINGE
PREFILLED_SYRINGE | INTRAVENOUS | Status: DC | PRN
Start: 2022-02-27 — End: 2022-02-27
  Administered 2022-02-27: 70 mg via INTRAVENOUS
  Administered 2022-02-27: 20 mg via INTRAVENOUS
  Administered 2022-02-27: 30 mg via INTRAVENOUS

## 2022-02-27 MED ORDER — PROPOFOL 10 MG/ML IV BOLUS
INTRAVENOUS | Status: AC
  Filled 2022-02-27: qty 20

## 2022-02-27 MED ORDER — KETOROLAC TROMETHAMINE 30 MG/ML IJ SOLN
INTRAMUSCULAR | Status: AC
  Filled 2022-02-27: qty 1

## 2022-02-27 MED ORDER — CEFAZOLIN SODIUM-DEXTROSE 2-4 GM/100ML-% IV SOLN
INTRAVENOUS | Status: AC
  Administered 2022-02-27: 2 g via INTRAVENOUS
  Filled 2022-02-27: qty 100

## 2022-02-27 MED ORDER — LIDOCAINE 2% (20 MG/ML) 5 ML SYRINGE
INTRAMUSCULAR | Status: DC | PRN
  Administered 2022-02-27: 100 mg via INTRAVENOUS

## 2022-02-27 MED ORDER — LIDOCAINE 2% (20 MG/ML) 5 ML SYRINGE
INTRAMUSCULAR | Status: AC
  Filled 2022-02-27: qty 5

## 2022-02-27 MED ORDER — ACETAMINOPHEN 10 MG/ML IV SOLN
INTRAVENOUS | Status: AC
  Filled 2022-02-27: qty 100

## 2022-02-27 MED ORDER — VANCOMYCIN HCL 1000 MG IV SOLR
INTRAVENOUS | Status: DC | PRN
  Administered 2022-02-27: 1000 mg

## 2022-02-27 MED ORDER — KETOROLAC TROMETHAMINE 30 MG/ML IJ SOLN
30.0000 mg | Freq: Once | INTRAMUSCULAR | Status: AC | PRN
  Administered 2022-02-27: 30 mg via INTRAVENOUS

## 2022-02-27 MED ORDER — DEXAMETHASONE SODIUM PHOSPHATE 10 MG/ML IJ SOLN
INTRAMUSCULAR | Status: DC | PRN
  Administered 2022-02-27: 10 mg via INTRAVENOUS

## 2022-02-27 MED ORDER — METFORMIN HCL 500 MG PO TABS
500.0000 mg | ORAL_TABLET | Freq: Three times a day (TID) | ORAL | Status: DC
  Administered 2022-02-27 – 2022-03-02 (×10): 500 mg via ORAL
  Filled 2022-02-27 (×10): qty 1

## 2022-02-27 MED ORDER — SODIUM CHLORIDE 0.9 % IV SOLN: INTRAVENOUS | Status: DC

## 2022-02-27 MED ORDER — CEFAZOLIN SODIUM-DEXTROSE 2-4 GM/100ML-% IV SOLN: 2.0000 g | Freq: Once | INTRAVENOUS | Status: DC

## 2022-02-27 MED ORDER — MIDAZOLAM HCL 2 MG/2ML IJ SOLN
INTRAMUSCULAR | Status: AC
Start: 2022-02-27 — End: ?
  Filled 2022-02-27: qty 2

## 2022-02-27 MED ORDER — POLYETHYLENE GLYCOL 3350 17 G PO PACK: 17.0000 g | PACK | Freq: Every day | ORAL | Status: DC | PRN

## 2022-02-27 MED ORDER — ONDANSETRON HCL 4 MG/2ML IJ SOLN
INTRAMUSCULAR | Status: DC | PRN
  Administered 2022-02-27: 4 mg via INTRAVENOUS

## 2022-02-27 MED ORDER — CEFAZOLIN SODIUM-DEXTROSE 2-3 GM-%(50ML) IV SOLR
INTRAVENOUS | Status: DC | PRN
Start: 2022-02-27 — End: 2022-02-27
  Administered 2022-02-27: 2 g via INTRAVENOUS

## 2022-02-27 MED ORDER — 0.9 % SODIUM CHLORIDE (POUR BTL) OPTIME
TOPICAL | Status: DC | PRN
  Administered 2022-02-27: 1000 mL

## 2022-02-27 MED ORDER — HYDRALAZINE HCL 10 MG PO TABS: 10.0000 mg | ORAL_TABLET | Freq: Four times a day (QID) | ORAL | Status: DC | PRN

## 2022-02-27 MED ORDER — STERILE WATER FOR IRRIGATION IR SOLN
Status: DC | PRN
  Administered 2022-02-27: 1000 mL

## 2022-02-27 MED ORDER — SUGAMMADEX SODIUM 200 MG/2ML IV SOLN
INTRAVENOUS | Status: DC | PRN
Start: 2022-02-27 — End: 2022-02-27
  Administered 2022-02-27: 250 mg via INTRAVENOUS

## 2022-02-27 MED ORDER — METOCLOPRAMIDE HCL 5 MG PO TABS: 5.0000 mg | ORAL_TABLET | Freq: Three times a day (TID) | ORAL | Status: DC | PRN

## 2022-02-27 MED ORDER — ACETAMINOPHEN 10 MG/ML IV SOLN
INTRAVENOUS | Status: DC | PRN
  Administered 2022-02-27: 1000 mg via INTRAVENOUS

## 2022-02-27 MED ORDER — LACTATED RINGERS IV SOLN: INTRAVENOUS | Status: DC | PRN

## 2022-02-27 MED ORDER — HYDROMORPHONE HCL 1 MG/ML IJ SOLN
0.2500 mg | INTRAMUSCULAR | Status: DC | PRN
  Administered 2022-02-27: 0.5 mg via INTRAVENOUS

## 2022-02-27 MED ORDER — CEFAZOLIN SODIUM-DEXTROSE 2-4 GM/100ML-% IV SOLN
2.0000 g | Freq: Three times a day (TID) | INTRAVENOUS | Status: AC
  Administered 2022-02-28 (×2): 2 g via INTRAVENOUS
  Filled 2022-02-27 (×2): qty 100

## 2022-02-27 MED ORDER — DEXAMETHASONE SODIUM PHOSPHATE 10 MG/ML IJ SOLN
INTRAMUSCULAR | Status: AC
Start: 2022-02-27 — End: ?
  Filled 2022-02-27: qty 1

## 2022-02-27 MED ORDER — FENTANYL CITRATE (PF) 250 MCG/5ML IJ SOLN
INTRAMUSCULAR | Status: AC
  Filled 2022-02-27: qty 5

## 2022-02-27 MED ORDER — PHENYLEPHRINE HCL-NACL 20-0.9 MG/250ML-% IV SOLN
INTRAVENOUS | Status: DC | PRN
  Administered 2022-02-27: 25 ug/min via INTRAVENOUS

## 2022-02-27 MED ORDER — METOCLOPRAMIDE HCL 5 MG/ML IJ SOLN: 5.0000 mg | Freq: Three times a day (TID) | INTRAMUSCULAR | Status: DC | PRN

## 2022-02-27 MED ORDER — PRAVASTATIN SODIUM 40 MG PO TABS
80.0000 mg | ORAL_TABLET | Freq: Every day | ORAL | Status: DC
  Administered 2022-02-27 – 2022-03-01 (×3): 80 mg via ORAL
  Filled 2022-02-27 (×3): qty 2

## 2022-02-27 MED ORDER — KETOROLAC TROMETHAMINE 15 MG/ML IJ SOLN
15.0000 mg | Freq: Three times a day (TID) | INTRAMUSCULAR | Status: DC
  Administered 2022-02-27 – 2022-03-02 (×8): 15 mg via INTRAVENOUS
  Filled 2022-02-27 (×9): qty 1

## 2022-02-27 MED ORDER — LACTATED RINGERS IV SOLN: INTRAVENOUS | Status: DC

## 2022-02-27 MED ORDER — EPHEDRINE SULFATE-NACL 50-0.9 MG/10ML-% IV SOSY
PREFILLED_SYRINGE | INTRAVENOUS | Status: DC | PRN
  Administered 2022-02-27: 7.5 mg via INTRAVENOUS

## 2022-02-27 MED ORDER — ORAL CARE MOUTH RINSE: 15.0000 mL | Freq: Once | OROMUCOSAL | Status: AC

## 2022-02-27 MED ORDER — DOCUSATE SODIUM 100 MG PO CAPS
100.0000 mg | ORAL_CAPSULE | Freq: Two times a day (BID) | ORAL | Status: DC
  Administered 2022-02-27 – 2022-03-02 (×6): 100 mg via ORAL
  Filled 2022-02-27 (×6): qty 1

## 2022-02-27 MED ORDER — PROPOFOL 10 MG/ML IV BOLUS
INTRAVENOUS | Status: DC | PRN
  Administered 2022-02-27: 200 mg via INTRAVENOUS

## 2022-02-27 MED ORDER — FENTANYL CITRATE (PF) 250 MCG/5ML IJ SOLN
INTRAMUSCULAR | Status: DC | PRN
  Administered 2022-02-27: 50 ug via INTRAVENOUS
  Administered 2022-02-27: 100 ug via INTRAVENOUS

## 2022-02-27 MED ORDER — ONDANSETRON HCL 4 MG/2ML IJ SOLN
INTRAMUSCULAR | Status: AC
  Filled 2022-02-27: qty 2

## 2022-02-27 MED ORDER — CHLORHEXIDINE GLUCONATE 0.12 % MT SOLN
OROMUCOSAL | Status: AC
  Administered 2022-02-27: 15 mL via OROMUCOSAL
  Filled 2022-02-27: qty 15

## 2022-02-27 MED ORDER — VANCOMYCIN HCL 1000 MG IV SOLR
INTRAVENOUS | Status: AC
  Filled 2022-02-27: qty 20

## 2022-02-27 MED ORDER — CHLORHEXIDINE GLUCONATE 0.12 % MT SOLN: 15.0000 mL | Freq: Once | OROMUCOSAL | Status: AC

## 2022-02-27 MED ORDER — INSULIN ASPART 100 UNIT/ML IJ SOLN
0.0000 [IU] | INTRAMUSCULAR | Status: DC | PRN
  Administered 2022-02-27: 2 [IU] via SUBCUTANEOUS
  Filled 2022-02-27: qty 1

## 2022-02-27 SURGICAL SUPPLY — 105 items
APL PRP STRL LF DISP 70% ISPRP (MISCELLANEOUS) ×2
BAG COUNTER SPONGE SURGICOUNT (BAG) ×3 IMPLANT
BAG SPNG CNTER NS LX DISP (BAG) ×1
BANDAGE ESMARK 6X9 LF (GAUZE/BANDAGES/DRESSINGS) ×2 IMPLANT
BIT DRILL CANN QC 4.3X180 (BIT) IMPLANT
BIT DRILL GUIDEWIRE 2.5X200 (WIRE) ×1 IMPLANT
BIT DRILL Q/COUPLING 1 (BIT) ×1 IMPLANT
BIT DRILL QC 2.5MM SHRT EVO SM (DRILL) IMPLANT
BLADE CLIPPER SURG (BLADE) IMPLANT
BNDG CMPR 9X6 STRL LF SNTH (GAUZE/BANDAGES/DRESSINGS)
BNDG CMPR MED 10X6 ELC LF (GAUZE/BANDAGES/DRESSINGS) ×1
BNDG COHESIVE 6X5 TAN STRL LF (GAUZE/BANDAGES/DRESSINGS) ×3 IMPLANT
BNDG ELASTIC 4X5.8 VLCR STR LF (GAUZE/BANDAGES/DRESSINGS) ×3 IMPLANT
BNDG ELASTIC 6X10 VLCR STRL LF (GAUZE/BANDAGES/DRESSINGS) ×3 IMPLANT
BNDG ELASTIC 6X5.8 VLCR STR LF (GAUZE/BANDAGES/DRESSINGS) ×3 IMPLANT
BNDG ESMARK 6X9 LF (GAUZE/BANDAGES/DRESSINGS)
BNDG GAUZE ELAST 4 BULKY (GAUZE/BANDAGES/DRESSINGS) ×4 IMPLANT
BRUSH SCRUB EZ PLAIN DRY (MISCELLANEOUS) ×5 IMPLANT
CANISTER SUCT 3000ML PPV (MISCELLANEOUS) ×2 IMPLANT
CHLORAPREP W/TINT 26 (MISCELLANEOUS) ×4 IMPLANT
COVER SURGICAL LIGHT HANDLE (MISCELLANEOUS) ×6 IMPLANT
CUFF TOURN SGL QUICK 18X4 (TOURNIQUET CUFF) IMPLANT
CUFF TOURN SGL QUICK 24 (TOURNIQUET CUFF)
CUFF TOURN SGL QUICK 34 (TOURNIQUET CUFF)
CUFF TRNQT CYL 24X4X16.5-23 (TOURNIQUET CUFF) IMPLANT
CUFF TRNQT CYL 34X4.125X (TOURNIQUET CUFF) IMPLANT
DRAPE C-ARM 42X72 X-RAY (DRAPES) ×3 IMPLANT
DRAPE C-ARMOR (DRAPES) ×3 IMPLANT
DRAPE HALF SHEET 40X57 (DRAPES) ×6 IMPLANT
DRAPE ORTHO SPLIT 77X108 STRL (DRAPES) ×4
DRAPE SURG 17X23 STRL (DRAPES) ×3 IMPLANT
DRAPE SURG ORHT 6 SPLT 77X108 (DRAPES) ×4 IMPLANT
DRAPE U-SHAPE 47X51 STRL (DRAPES) ×3 IMPLANT
DRILL BIT 4.3MM (BIT) ×2
DRILL QC 2.5MM SHORT EVOS SM (DRILL) ×2
DRSG ADAPTIC 3X8 NADH LF (GAUZE/BANDAGES/DRESSINGS) ×3 IMPLANT
DRSG MEPILEX BORDER 4X12 (GAUZE/BANDAGES/DRESSINGS) ×2 IMPLANT
DRSG MEPILEX BORDER 4X4 (GAUZE/BANDAGES/DRESSINGS) IMPLANT
DRSG MEPILEX BORDER 4X8 (GAUZE/BANDAGES/DRESSINGS) ×2 IMPLANT
DRSG PAD ABDOMINAL 8X10 ST (GAUZE/BANDAGES/DRESSINGS) ×6 IMPLANT
ELECT REM PT RETURN 9FT ADLT (ELECTROSURGICAL) ×2
ELECTRODE REM PT RTRN 9FT ADLT (ELECTROSURGICAL) ×2 IMPLANT
EXTRACTOR STRIPED SCREW 8X03 (INSTRUMENTS) ×1 IMPLANT
EXTRACTOR STRIPPED SCREW 4 (INSTRUMENTS) ×1 IMPLANT
GAUZE SPONGE 4X4 12PLY STRL (GAUZE/BANDAGES/DRESSINGS) ×2 IMPLANT
GLOVE BIO SURGEON STRL SZ 6.5 (GLOVE) ×9 IMPLANT
GLOVE BIO SURGEON STRL SZ7.5 (GLOVE) ×12 IMPLANT
GLOVE BIOGEL PI IND STRL 6.5 (GLOVE) ×2 IMPLANT
GLOVE BIOGEL PI IND STRL 7.5 (GLOVE) ×2 IMPLANT
GLOVE BIOGEL PI INDICATOR 6.5 (GLOVE) ×1
GLOVE BIOGEL PI INDICATOR 7.5 (GLOVE) ×1
GOWN STRL REUS W/ TWL LRG LVL3 (GOWN DISPOSABLE) ×6 IMPLANT
GOWN STRL REUS W/TWL LRG LVL3 (GOWN DISPOSABLE) ×6
K-WIRE 1.6 (WIRE) ×2
K-WIRE FX150X1.6XTROC PNT (WIRE) ×1
KIT BASIN OR (CUSTOM PROCEDURE TRAY) ×3 IMPLANT
KIT TURNOVER KIT B (KITS) ×3 IMPLANT
KWIRE FX150X1.6XTROC PNT (WIRE) IMPLANT
MANIFOLD NEPTUNE II (INSTRUMENTS) ×3 IMPLANT
NEEDLE 22X1 1/2 (OR ONLY) (NEEDLE) IMPLANT
NS IRRIG 1000ML POUR BTL (IV SOLUTION) ×3 IMPLANT
PACK ORTHO EXTREMITY (CUSTOM PROCEDURE TRAY) ×3 IMPLANT
PACK TOTAL JOINT (CUSTOM PROCEDURE TRAY) ×3 IMPLANT
PAD ARMBOARD 7.5X6 YLW CONV (MISCELLANEOUS) ×6 IMPLANT
PAD CAST 4YDX4 CTTN HI CHSV (CAST SUPPLIES) ×2 IMPLANT
PADDING CAST COTTON 4X4 STRL (CAST SUPPLIES) ×2
PADDING CAST COTTON 6X4 STRL (CAST SUPPLIES) ×7 IMPLANT
PLATE CONDYLAR 12 HOLE (Plate) ×1 IMPLANT
PLATE EVOS 3.5 RT 115 MED DIST (Plate) ×1 IMPLANT
SCREW CORT ST EVOS 3.5X75 (Screw) ×1 IMPLANT
SCREW CORT ST EVOS 3.5X80 (Screw) ×1 IMPLANT
SCREW CORTEX ST 4.5X40 (Screw) ×1 IMPLANT
SCREW CORTEX ST 4.5X44 (Screw) ×2 IMPLANT
SCREW CORTEX ST 4.5X46 (Screw) ×1 IMPLANT
SCREW CORTEX ST 4.5X60 (Screw) ×1 IMPLANT
SCREW CTX ST EVOS 3.5X40 (Screw) ×2 IMPLANT
SCREW LOCK ST EVOS 3.5X38 (Screw) ×1 IMPLANT
SCREW LOCKING 3.5X75 (Screw) ×1 IMPLANT
SCREW LOCKING VA 5.0X70MM (Screw) ×1 IMPLANT
SCREW LOCKING VA 5.0X90MM (Screw) ×3 IMPLANT
SPONGE T-LAP 18X18 ~~LOC~~+RFID (SPONGE) ×3 IMPLANT
STAPLER VISISTAT 35W (STAPLE) ×2 IMPLANT
STOCKINETTE IMPERVIOUS LG (DRAPES) ×3 IMPLANT
STRIP CLOSURE SKIN 1/2X4 (GAUZE/BANDAGES/DRESSINGS) IMPLANT
SUCTION FRAZIER HANDLE 10FR (MISCELLANEOUS) ×2
SUCTION TUBE FRAZIER 10FR DISP (MISCELLANEOUS) ×2 IMPLANT
SUT ETHILON 3 0 PS 1 (SUTURE) ×7 IMPLANT
SUT MNCRL AB 3-0 PS2 18 (SUTURE) ×2 IMPLANT
SUT MON AB 2-0 CT1 36 (SUTURE) ×3 IMPLANT
SUT PDS AB 2-0 CT1 27 (SUTURE) IMPLANT
SUT VIC AB 0 CT1 27 (SUTURE)
SUT VIC AB 0 CT1 27XBRD ANBCTR (SUTURE) IMPLANT
SUT VIC AB 1 CT1 27 (SUTURE) ×2
SUT VIC AB 1 CT1 27XBRD ANBCTR (SUTURE) IMPLANT
SUT VIC AB 2-0 CT1 (SUTURE) ×2 IMPLANT
SUT VIC AB 2-0 CT1 27 (SUTURE) ×4
SUT VIC AB 2-0 CT1 TAPERPNT 27 (SUTURE) ×4 IMPLANT
SYR CONTROL 10ML LL (SYRINGE) IMPLANT
TOWEL GREEN STERILE (TOWEL DISPOSABLE) ×6 IMPLANT
TOWEL GREEN STERILE FF (TOWEL DISPOSABLE) ×6 IMPLANT
TRAY FOLEY MTR SLVR 16FR STAT (SET/KITS/TRAYS/PACK) IMPLANT
TUBE CONNECTING 12X1/4 (SUCTIONS) ×3 IMPLANT
UNDERPAD 30X36 HEAVY ABSORB (UNDERPADS AND DIAPERS) ×3 IMPLANT
WATER STERILE IRR 1000ML POUR (IV SOLUTION) ×6 IMPLANT
YANKAUER SUCT BULB TIP NO VENT (SUCTIONS) ×3 IMPLANT

## 2022-02-27 NOTE — Transfer of Care (Signed)
Immediate Anesthesia Transfer of Care Note ? ?Patient: [name] ? ?Procedure(s) Performed: OPEN REDUCTION INTERNAL FIXATION (ORIF) DISTAL FEMUR FRACTURE (Right: Leg Upper) ?HARDWARE REMOVAL FEMUR (Right: Leg Upper) ? ?Patient Location: PACU ? ?Anesthesia Type:General ? ?Level of Consciousness: awake, alert  and oriented ? ?Airway & Oxygen Therapy: Patient Spontanous Breathing and Patient connected to face mask oxygen ? ?Post-op Assessment: Report given to RN and Post -op Vital signs reviewed and stable ? ?Post vital signs: Reviewed and stable ? ?Last Vitals:  ?Vitals Value Taken Time  ?BP 117/65 02/27/22 1428  ?Temp 37.2 ?C 02/27/22 1426  ?Pulse 78 02/27/22 1430  ?Resp 9 02/27/22 1430  ?SpO2 95 % 02/27/22 1430  ?Vitals shown include unvalidated device data. ? ?Last Pain:  ?Vitals:  ? 02/27/22 0832  ?TempSrc:   ?PainSc: 0-No pain  ?   ? ?Patients Stated Pain Goal: 0 (02/26/22 2224) ? ?Complications: No notable events documented. ?
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
## 2022-02-27 NOTE — Anesthesia Preprocedure Evaluation (Signed)
Anesthesia Evaluation  ? ? ?Airway ?Mallampati: II ? ? ? ? ? ? Dental ? ?(+) Caps,  ?  ?Pulmonary ?former smoker,  ?  ?Pulmonary exam normal ? ? ? ? ? ? ? Cardiovascular ?hypertension, Pt. on medications ?Normal cardiovascular exam ? ? ?  ?Neuro/Psych ?negative psych ROS  ? GI/Hepatic ?negative GI ROS, Neg liver ROS,   ?Endo/Other  ?diabetes, Oral Hypoglycemic AgentsMorbid obesity ? Renal/GU ?negative Renal ROS  ?negative genitourinary ?  ?Musculoskeletal ? ? Abdominal ?(+) + obese,   ?Peds ? Hematology ? ?(+) Blood dyscrasia, anemia ,   ?Anesthesia Other Findings ? ? Reproductive/Obstetrics ? ?  ? ? ? ? ? ? ? ? ? ? ? ? ? ?  ?  ? ? ? ? ? ? ? ? ?Anesthesia Physical ?Anesthesia Plan ? ?ASA: 3 ? ?Anesthesia Plan: General  ? ?Post-op Pain Management: Ofirmev IV (intra-op)*  ? ?Induction: Intravenous ? ?PONV Risk Score and Plan: Ondansetron, Midazolam and Treatment may vary due to age or medical condition ? ?Airway Management Planned: Oral ETT ? ?Additional Equipment: None ? ?Intra-op Plan:  ? ?Post-operative Plan: Extubation in OR ? ?Informed Consent: I have reviewed the patients History and Physical, chart, labs and discussed the procedure including the risks, benefits and alternatives for the proposed anesthesia with the patient or authorized representative who has indicated his/her understanding and acceptance.  ? ? ? ?Dental advisory given ? ?Plan Discussed with: CRNA ? ?Anesthesia Plan Comments:   ? ? ? ? ? ? ?Anesthesia Quick Evaluation ? ?

## 2022-02-27 NOTE — Progress Notes (Signed)
PT Cancellation Note ? ?Patient Details ?Name: Jeremiah Baxter ?MRN: [id number] ?DOB: 1956-09-02 ? ? ?Cancelled Treatment:    Reason Eval/Treat Not Completed: Patient at procedure or test/unavailable, AM: pt off unit in OR, PM: pt declining mobility stating fatigue and pain post sx. Will check back as schedule allows to continue with PT POC. ? ? ? ?Betsey Holiday Jeweliana Dudgeon ?02/27/2022, 3:52 PM ?

## 2022-02-27 NOTE — Anesthesia Postprocedure Evaluation (Signed)
Anesthesia Post Note ? ?Patient: Jeremiah Baxter ? ?Procedure(s) Performed: OPEN REDUCTION INTERNAL FIXATION (ORIF) DISTAL FEMUR FRACTURE (Right: Leg Upper) ?HARDWARE REMOVAL FEMUR (Right: Leg Upper) ? ?  ? ?Patient location during evaluation: PACU ?Anesthesia Type: General ?Level of consciousness: awake and alert ?Pain management: pain level controlled ?Vital Signs Assessment: post-procedure vital signs reviewed and stable ?Respiratory status: spontaneous breathing, nonlabored ventilation and respiratory function stable ?Cardiovascular status: stable and blood pressure returned to baseline ?Anesthetic complications: no ? ? ?No notable events documented. ? ?Last Vitals:  ?Vitals:  ? 02/27/22 1511 02/27/22 1522  ?BP: 118/61 123/63  ?Pulse: 75 77  ?Resp: 14 18  ?Temp: 37.1 ?C 36.9 ?C  ?SpO2: 95% 94%  ?  ?Last Pain:  ?Vitals:  ? 02/27/22 1511  ?TempSrc:   ?PainSc: 0-No pain  ? ? ?  ?  ?  ?  ?  ?  ? ?Audry Pili ? ? ? ? ?

## 2022-02-27 NOTE — Anesthesia Procedure Notes (Signed)
Procedure Name: Intubation ?Date/Time: 02/27/2022 10:53 AM ?Performed by: Bryson Corona, CRNA ?Pre-anesthesia Checklist: Patient identified, Emergency Drugs available, Suction available and Patient being monitored ?Patient Re-evaluated:Patient Re-evaluated prior to induction ?Oxygen Delivery Method: Circle System Utilized ?Preoxygenation: Pre-oxygenation with 100% oxygen ?Induction Type: IV induction ?Ventilation: Oral airway inserted - appropriate to patient size ?Laryngoscope Size: Mac and 4 ?Grade View: Grade I ?Tube type: Oral ?Tube size: 7.0 mm ?Number of attempts: 1 ?Airway Equipment and Method: Stylet and Oral airway ?Placement Confirmation: ETT inserted through vocal cords under direct vision, positive ETCO2 and breath sounds checked- equal and bilateral ?Secured at: 22 cm ?Tube secured with: Tape ?Dental Injury: Teeth and Oropharynx as per pre-operative assessment  ?Comments: Large periorbital tissue. ? ? ? ? ?

## 2022-02-27 NOTE — Interval H&P Note (Signed)
History and Physical Interval Note: ? ?02/27/2022 ?9:55 AM ? ?Jeremiah Baxter  has presented today for surgery, with the diagnosis of Right distal femur fracture/femoral nonunion.  The various methods of treatment have been discussed with the patient and family. After consideration of risks, benefits and other options for treatment, the patient has consented to  Procedure(s): ?OPEN REDUCTION INTERNAL FIXATION (ORIF) DISTAL FEMUR FRACTURE (Right) ?HARDWARE REMOVAL FEMUR (Right) as a surgical intervention.  The patient's history has been reviewed, patient examined, no change in status, stable for surgery.  I have reviewed the patient's chart and labs.  Questions were answered to the patient's satisfaction.   ? ? ?Lennette Bihari P Janaiyah Blackard ? ? ?

## 2022-02-27 NOTE — Progress Notes (Signed)
Orthopedic Tech Progress Note ?Patient Details:  ?Dwon T Patsey Berthold ?July 05, 1956 ?[id number] ? ?PACU RN called requesting a [place] [facility] for patient, so I called orders in to [facility] for an [place] ? ?Patient ID: ESTEN DOLLAR, male   DOB: 12-31-1955, 66 y.o.   MRN: [id number] ? ?Janit Pagan ?02/27/2022, 2:46 PM ? ?

## 2022-02-28 LAB — CBC
HCT: 26 % — ABNORMAL LOW (ref 39.0–52.0)
Hemoglobin: 9.2 g/dL — ABNORMAL LOW (ref 13.0–17.0)
MCH: 30.5 pg (ref 26.0–34.0)
MCHC: 35.4 g/dL (ref 30.0–36.0)
MCV: 86.1 fL (ref 80.0–100.0)
Platelets: 265 10*3/uL (ref 150–400)
RBC: 3.02 MIL/uL — ABNORMAL LOW (ref 4.22–5.81)
RDW: 12.3 % (ref 11.5–15.5)
WBC: 11 10*3/uL — ABNORMAL HIGH (ref 4.0–10.5)
nRBC: 0 % (ref 0.0–0.2)

## 2022-02-28 LAB — BASIC METABOLIC PANEL
Anion gap: 10 (ref 5–15)
BUN: 28 mg/dL — ABNORMAL HIGH (ref 8–23)
CO2: 24 mmol/L (ref 22–32)
Calcium: 8.7 mg/dL — ABNORMAL LOW (ref 8.9–10.3)
Chloride: 100 mmol/L (ref 98–111)
Creatinine, Ser: 1.53 mg/dL — ABNORMAL HIGH (ref 0.61–1.24)
GFR, Estimated: 51 mL/min — ABNORMAL LOW (ref >60)
Glucose, Bld: 337 mg/dL — ABNORMAL HIGH (ref 70–99)
Potassium: 4.4 mmol/L (ref 3.5–5.1)
Sodium: 134 mmol/L — ABNORMAL LOW (ref 135–145)

## 2022-02-28 LAB — GLUCOSE, CAPILLARY
Glucose-Capillary: 285 mg/dL — ABNORMAL HIGH (ref 70–99)
Glucose-Capillary: 312 mg/dL — ABNORMAL HIGH (ref 70–99)
Glucose-Capillary: 206 mg/dL — ABNORMAL HIGH (ref 70–99)
Glucose-Capillary: 218 mg/dL — ABNORMAL HIGH (ref 70–99)
Glucose-Capillary: 226 mg/dL — ABNORMAL HIGH (ref 70–99)

## 2022-02-28 MED ORDER — DICLOFENAC SODIUM 1 % EX GEL
2.0000 g | Freq: Four times a day (QID) | CUTANEOUS | Status: DC
  Administered 2022-02-28 – 2022-03-02 (×11): 2 g via TOPICAL
  Filled 2022-02-28: qty 100

## 2022-02-28 MED ORDER — INSULIN ASPART 100 UNIT/ML IJ SOLN
4.0000 [IU] | Freq: Three times a day (TID) | INTRAMUSCULAR | Status: DC
  Administered 2022-02-28 – 2022-03-02 (×6): 4 [IU] via SUBCUTANEOUS
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
## 2022-02-28 NOTE — Progress Notes (Signed)
OT Cancellation Note ? ?Patient Details ?Name: Jeremiah Baxter ?MRN: [id number] ?DOB: June 12, 1956 ? ? ?Cancelled Treatment:    Reason Eval/Treat Not Completed: Other (comment) Awaiting pain premedication prior to initiating OOB attempts. RN aware. Will follow-up as schedule permits. ? ?[name] ?02/28/2022, 8:38 AM ?
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
## 2022-02-28 NOTE — Progress Notes (Signed)
Inpatient Diabetes Program Recommendations ? ?AACE/ADA: New Consensus Statement on Inpatient Glycemic Control (2015) ? ?Target Ranges:  Prepandial:   less than 140 mg/dL ?     Peak postprandial:   less than 180 mg/dL (1-2 hours) ?     Critically ill patients:  140 - 180 mg/dL  ? ?Lab Results  ?Component Value Date  ? GLUCAP 285 (H) 02/28/2022  ? HGBA1C 8.7 (H) 02/23/2022  ? ? ?Review of Glycemic Control ? Latest Reference Range & Units 02/27/22 08:26 02/27/22 10:36 02/27/22 14:28 02/27/22 15:25 02/27/22 16:47 02/27/22 19:27 02/28/22 07:28  ?Glucose-Capillary 70 - 99 mg/dL 155 (H) 151 (H) 217 (H) 237 (H) 326 (H) 365 (H) 285 (H)  ? ?Diabetes history: DM 2 ?Outpatient Diabetes medications:  ?Current orders for Inpatient glycemic control:  ?Metformin 500 mg tid ?Semglee 12 units qhs ?Novolog 0-15 units tid ? ?A1c 8.7% ? ?Decadron 10 mg given during surgery ? ?Inpatient Diabetes Program Recommendations:   ? ?-  Add Novolog 4 units tid meal coverage if eating >50% of meals. ? ?Thanks, ? ?Tama Headings RN, MSN, BC-ADM ?Inpatient Diabetes Coordinator ?Team Pager [phone] (8a-5p) ? ? ? ?
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
## 2022-03-01 ENCOUNTER — Encounter (HOSPITAL_COMMUNITY): Payer: Self-pay | Admitting: Student

## 2022-03-01 LAB — GLUCOSE, CAPILLARY
Glucose-Capillary: 140 mg/dL — ABNORMAL HIGH (ref 70–99)
Glucose-Capillary: 251 mg/dL — ABNORMAL HIGH (ref 70–99)
Glucose-Capillary: 111 mg/dL — ABNORMAL HIGH (ref 70–99)
Glucose-Capillary: 217 mg/dL — ABNORMAL HIGH (ref 70–99)

## 2022-03-01 LAB — CBC
HCT: 25.6 % — ABNORMAL LOW (ref 39.0–52.0)
Hemoglobin: 8.7 g/dL — ABNORMAL LOW (ref 13.0–17.0)
MCH: 29.9 pg (ref 26.0–34.0)
MCHC: 34 g/dL (ref 30.0–36.0)
MCV: 88 fL (ref 80.0–100.0)
Platelets: 279 10*3/uL (ref 150–400)
RBC: 2.91 MIL/uL — ABNORMAL LOW (ref 4.22–5.81)
RDW: 12.6 % (ref 11.5–15.5)
WBC: 10.4 10*3/uL (ref 4.0–10.5)
nRBC: 0 % (ref 0.0–0.2)

## 2022-03-01 NOTE — Progress Notes (Signed)
Mobility Specialist Progress Note  ? ? 03/01/22 1513  ?Mobility  ?Activity Ambulated with assistance in room  ?Level of Assistance Contact guard assist, steadying assist  ?Assistive Device Front wheel walker  ?RLE Weight Bearing NWB  ?Distance Ambulated (ft) 20 ft  ?Activity Response Tolerated fair  ?$Mobility charge 1 Mobility  ? ?Pt received in bed and agreeable. C/o shoulder pain. Rested foot on floor and needed min cueing for maintenance of NWB. Returned to bed with call bell in reach.  ? ?Hildred Alamin ?Mobility Specialist  ?Primary: 5N M.S. Phone: [phone] ?Secondary: 6N M.S. Phone: [phone] ?  ?
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
## 2022-03-02 ENCOUNTER — Inpatient Hospital Stay (HOSPITAL_COMMUNITY): Payer: Medicare Other

## 2022-03-02 ENCOUNTER — Other Ambulatory Visit (HOSPITAL_COMMUNITY): Payer: Self-pay

## 2022-03-02 DIAGNOSIS — M79661 Pain in right lower leg: Secondary | ICD-10-CM

## 2022-03-02 LAB — GLUCOSE, CAPILLARY
Glucose-Capillary: 153 mg/dL — ABNORMAL HIGH (ref 70–99)
Glucose-Capillary: 216 mg/dL — ABNORMAL HIGH (ref 70–99)
Glucose-Capillary: 172 mg/dL — ABNORMAL HIGH (ref 70–99)

## 2022-03-02 LAB — CBC
HCT: 25.3 % — ABNORMAL LOW (ref 39.0–52.0)
Hemoglobin: 8.9 g/dL — ABNORMAL LOW (ref 13.0–17.0)
MCH: 30.9 pg (ref 26.0–34.0)
MCHC: 35.2 g/dL (ref 30.0–36.0)
MCV: 87.8 fL (ref 80.0–100.0)
Platelets: 272 10*3/uL (ref 150–400)
RBC: 2.88 MIL/uL — ABNORMAL LOW (ref 4.22–5.81)
RDW: 12.8 % (ref 11.5–15.5)
WBC: 8.4 10*3/uL (ref 4.0–10.5)
nRBC: 0 % (ref 0.0–0.2)

## 2022-03-02 LAB — CREATININE, SERUM
Creatinine, Ser: 1.18 mg/dL (ref 0.61–1.24)
GFR, Estimated: >60 mL/min (ref >60)

## 2022-03-02 MED ORDER — INSULIN GLARGINE 100 UNIT/ML ~~LOC~~ SOLN: 12.0000 [IU] | Freq: Every day | SUBCUTANEOUS | 0 refills | Status: DC

## 2022-03-02 MED ORDER — HUMALOG 100 UNIT/ML ~~LOC~~ SOCT: 4.0000 [IU] | Freq: Three times a day (TID) | SUBCUTANEOUS | 0 refills | Status: DC

## 2022-03-02 MED ORDER — CHOLECALCIFEROL 125 MCG (5000 UT) PO TABS: ORAL_TABLET | Freq: Every day | ORAL | 0 refills | Status: DC

## 2022-03-02 MED ORDER — ASCORBIC ACID 500 MG PO TABS
500.0000 mg | ORAL_TABLET | Freq: Every day | ORAL | 0 refills | Status: AC
Start: 2022-03-03 — End: 2022-04-17

## 2022-03-02 MED ORDER — OXYCODONE HCL 10 MG PO TABS
5.0000 mg | ORAL_TABLET | ORAL | 0 refills | Status: DC | PRN
  Filled 2022-03-02: qty 42, 7d supply, fill #0

## 2022-03-02 MED ORDER — APIXABAN 2.5 MG PO TABS
2.5000 mg | ORAL_TABLET | Freq: Two times a day (BID) | ORAL | 0 refills | Status: DC
  Filled 2022-03-02: qty 60, 30d supply, fill #0

## 2022-03-02 MED ORDER — METHOCARBAMOL 500 MG PO TABS
1000.0000 mg | ORAL_TABLET | Freq: Three times a day (TID) | ORAL | 0 refills | Status: DC
Start: 2022-03-02 — End: 2023-11-22
  Filled 2022-03-02: qty 126, 21d supply, fill #0

## 2022-03-02 MED ORDER — CHOLECALCIFEROL 125 MCG (5000 UT) PO TABS
ORAL_TABLET | Freq: Every day | ORAL | 0 refills | Status: DC
  Filled 2022-03-02: qty 90, 90d supply, fill #0

## 2022-03-02 MED ORDER — INSULIN GLARGINE 100 UNIT/ML ~~LOC~~ SOLN
12.0000 [IU] | Freq: Every day | SUBCUTANEOUS | 11 refills | Status: DC
  Filled 2022-03-02: qty 10, 83d supply, fill #0

## 2022-03-02 MED ORDER — INSULIN LISPRO 100 UNIT/ML IJ SOLN
4.0000 [IU] | Freq: Three times a day (TID) | INTRAMUSCULAR | 11 refills | Status: DC
Start: 2022-03-02 — End: 2022-03-02
  Filled 2022-03-02: qty 10, 84d supply, fill #0

## 2022-03-02 MED ORDER — ACETAMINOPHEN 500 MG PO TABS: 1000.0000 mg | ORAL_TABLET | Freq: Three times a day (TID) | ORAL | 0 refills | Status: DC

## 2022-03-02 MED ORDER — ASCORBIC ACID 500 MG PO TABS
500.0000 mg | ORAL_TABLET | Freq: Every day | ORAL | 0 refills | Status: DC
  Filled 2022-03-02: qty 45, 45d supply, fill #0
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
## 2022-03-02 NOTE — Progress Notes (Signed)
RLE venous duplex has been completed.    Results can be found under chart review under CV PROC. 03/02/2022 4:29 PM Dayron Odland RVT, RDMS

## 2022-03-02 NOTE — Discharge Instructions (Addendum)
Orthopaedic Trauma Service Discharge Instructions   General Discharge Instructions  WEIGHT BEARING STATUS:Non-weightbearing right leg  RANGE OF MOTION/ACTIVITY:Ok for knee range of motion in the hinged brace  Wound Care:You may remove your surgical dressings. Incisions can be left open to air if there is no drainage. If incision continues to have drainage, follow wound care instructions below. Okay to shower if no drainage from incisions.  DVT/PE prophylaxis:  Eliquis 2.5 mg twice daily x 30 days  Diet: as you were eating previously.  Can use over the counter stool softeners and bowel preparations, such as Miralax, to help with bowel movements.  Narcotics can be constipating.  Be sure to drink plenty of fluids  PAIN MEDICATION USE AND EXPECTATIONS  You have likely been given narcotic medications to help control your pain.  After a traumatic event that results in an fracture (broken bone) with or without surgery, it is ok to use narcotic pain medications to help control one's pain.  We understand that everyone responds to pain differently and each individual patient will be evaluated on a regular basis for the continued need for narcotic medications. Ideally, narcotic medication use should last no more than 6-8 weeks (coinciding with fracture healing).   As a patient it is your responsibility as well to monitor narcotic medication use and report the amount and frequency you use these medications when you come to your office visit.   We would also advise that if you are using narcotic medications, you should take a dose prior to therapy to maximize you participation.  IF YOU ARE ON NARCOTIC MEDICATIONS IT IS NOT PERMISSIBLE TO OPERATE A MOTOR VEHICLE (MOTORCYCLE/CAR/TRUCK/MOPED) OR HEAVY MACHINERY DO NOT MIX NARCOTICS WITH OTHER CNS (CENTRAL NERVOUS SYSTEM) DEPRESSANTS SUCH AS ALCOHOL   STOP SMOKING OR USING NICOTINE PRODUCTS!!!!  As discussed nicotine severely impairs your body's ability  to heal surgical and traumatic wounds but also impairs bone healing.  Wounds and bone heal by forming microscopic blood vessels (angiogenesis) and nicotine is a vasoconstrictor (essentially, shrinks blood vessels).  Therefore, if vasoconstriction occurs to these microscopic blood vessels they essentially disappear and are unable to deliver necessary nutrients to the healing tissue.  This is one modifiable factor that you can do to dramatically increase your chances of healing your injury.    (This means no smoking, no nicotine gum, patches, etc)  DO NOT USE NONSTEROIDAL ANTI-INFLAMMATORY DRUGS (NSAID'S)  Using products such as Advil (ibuprofen), Aleve (naproxen), Motrin (ibuprofen) for additional pain control during fracture healing can delay and/or prevent the healing response.  If you would like to take over the counter (OTC) medication, Tylenol (acetaminophen) is ok.  However, some narcotic medications that are given for pain control contain acetaminophen as well. Therefore, you should not exceed more than 4000 mg of tylenol in a day if you do not have liver disease.  Also note that there are may OTC medicines, such as cold medicines and allergy medicines that my contain tylenol as well.  If you have any questions about medications and/or interactions please ask your doctor/PA or your pharmacist.      ICE AND ELEVATE INJURED/OPERATIVE EXTREMITY  Using ice and elevating the injured extremity above your heart can help with swelling and pain control.  Icing in a pulsatile fashion, such as 20 minutes on and 20 minutes off, can be followed.    Do not place ice directly on skin. Make sure there is a barrier between to skin and the ice pack.  Using frozen items such as frozen peas works well as the conform nicely to the are that needs to be iced.  USE AN ACE WRAP OR TED HOSE FOR SWELLING CONTROL  In addition to icing and elevation, Ace wraps or TED hose are used to help limit and resolve swelling.  It is  recommended to use Ace wraps or TED hose until you are informed to stop.    When using Ace Wraps start the wrapping distally (farthest away from the body) and wrap proximally (closer to the body)   Example: If you had surgery on your leg or thing and you do not have a splint on, start the ace wrap at the toes and work your way up to the thigh        If you had surgery on your upper extremity and do not have a splint on, start the ace wrap at your fingers and work your way up to the upper arm   [place]: [phone]   VISIT OUR WEBSITE FOR ADDITIONAL INFORMATION: [email or web address]    Discharge Wound Care Instructions  Do NOT apply any ointments, solutions or lotions to pin sites or surgical wounds.  These prevent needed drainage and even though solutions like hydrogen peroxide kill bacteria, they also damage cells lining the pin sites that help fight infection.  Applying lotions or ointments can keep the wounds moist and can cause them to breakdown and open up as well. This can increase the risk for infection. When in doubt call the office.  Surgical incisions should be dressed daily.  If any drainage is noted, use one layer of adaptic or Mepitel, then gauze, Kerlix, and an ace wrap. - These dressing supplies should be available at local medical supply stores [facility], [facility], etc) as well as Management consultant ([facility], [facility], [facility], etc)  Once the incision is completely dry and without drainage, it may be left open to air out.  Showering may begin 36-48 hours later.  Cleaning gently with soap and water.  Traumatic wounds should be dressed daily as well.    One layer of adaptic, gauze, Kerlix, then ace wrap.  The adaptic can be discontinued once the draining has ceased    If you have a wet to dry dressing: wet the gauze with saline the squeeze as much saline out so the gauze is moist (not soaking wet), place moistened gauze over wound,  then place a dry gauze over the moist one, followed by Kerlix wrap, then ace wrap.
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
## 2022-03-02 NOTE — Progress Notes (Signed)
Orthopaedic Trauma Progress Note  SUBJECTIVE: Doing okay this morning.  Pain currently well controlled but does continue to have some right calf tenderness. Venous U/S scheduled for 1:00PM today. Feels like he si ready to go home this afternoon  OBJECTIVE:  Vitals:   03/02/22 0441 03/02/22 0733  BP: (!) 106/58 (!) 119/56  Pulse: 81 73  Resp: 16   Temp: 98.5 F (36.9 C) 98.5 F (36.9 C)  SpO2: 96% 97%    General: Laying in bed, no acute distress Respiratory: No increased work of breathing.  Right lower extremity: Dressings CDI. Bledsoe brace in place in unlocked position.  Tender with palpation through the calf. Decreased sensation over the plantar and dorsal aspect of the foot, patient notes this is baseline.  Sensation otherwise intact throughout the remainder of the extremity.  Tolerates ankle dorsiflexion and plantarflexion.  Motion of the toes limited at baseline.  Toes warm and well-perfused  IMAGING: Stable post op imaging.   LABS:  Results for orders placed or performed during the hospital encounter of 02/23/22 (from the past 24 hour(s))  Glucose, capillary     Status: Abnormal   Collection Time: 03/01/22 11:44 AM  Result Value Ref Range   Glucose-Capillary 251 (H) 70 - 99 mg/dL  Glucose, capillary     Status: Abnormal   Collection Time: 03/01/22  4:34 PM  Result Value Ref Range   Glucose-Capillary 111 (H) 70 - 99 mg/dL  Glucose, capillary     Status: Abnormal   Collection Time: 03/01/22  9:22 PM  Result Value Ref Range   Glucose-Capillary 217 (H) 70 - 99 mg/dL  Creatinine, serum     Status: None   Collection Time: 03/02/22  6:38 AM  Result Value Ref Range   Creatinine, Ser 1.18 0.61 - 1.24 mg/dL   GFR, Estimated >60 >60 mL/min  CBC     Status: Abnormal   Collection Time: 03/02/22  6:38 AM  Result Value Ref Range   WBC 8.4 4.0 - 10.5 K/uL   RBC 2.88 (L) 4.22 - 5.81 MIL/uL   Hemoglobin 8.9 (L) 13.0 - 17.0 g/dL   HCT 25.3 (L) 39.0 - 52.0 %   MCV 87.8 80.0 - 100.0  fL   MCH 30.9 26.0 - 34.0 pg   MCHC 35.2 30.0 - 36.0 g/dL   RDW 12.8 11.5 - 15.5 %   Platelets 272 150 - 400 K/uL   nRBC 0.0 0.0 - 0.2 %  Glucose, capillary     Status: Abnormal   Collection Time: 03/02/22  7:35 AM  Result Value Ref Range   Glucose-Capillary 153 (H) 70 - 99 mg/dL    ASSESSMENT: Jeremiah Baxter is a 66 y.o. male, 3 Days Post-Op s/p ORIF DISTAL FEMUR FRACTURE WITH HARDWARE REMOVAL FEMUR  CV/Blood loss: Acute blood loss anemia, Hgb 8.9 this morning. Hemodynamically stable.   PLAN: Weightbearing: NWB RLE ROM: Okay for unrestricted knee motion in Bledsoe brace Incisional and dressing care: Change as needed.  Okay to leave open to air if no drainage Showering: Okay to begin showering and getting incisions wet if no drainage Orthopedic device(s):  Bledsoe brace RLE   Pain management:  1. Tylenol 1000 mg q 6 hours scheduled 2. Robaxin 1000 mg TID 3. Oxycodone 5-15 mg q 4 hours PRN 4. Morphine 2 mg q 4 hours PRN 5. Neurontin 300 mg 3 times daily 6. Voltaren gel 1% 4 times daily to left knee VTE prophylaxis: Lovenox, SCDs ID:  Ancef 2gm post op  completed Foley/Lines:  No foley, KVO IVFs Impediments to Fracture Healing: Vit D level 21, started on D3 supplementation Dispo: Order venous ultrasound today to rule out DVT right lower extremity.  Therapies as tolerated, PT/OT recommending no formal follow-ups at time of discharge.  Patient will likely be NWB RLE for a total of 6-8 weeks.  Plan for d/c home this afternoon after venous U/S completed  D/C recommendations: - Oxycodone for pain control - Eliquis for DVT prophylaxis - 1000 units daily Vit D supplementation  Follow - up plan: 2 weeks after d/c for wound check and repeat x-rays   Contact information:  Katha Hamming MD, Rushie Nyhan PA-C. After hours and holidays please check [email or web address] for group call information for [facility]   Gwinda Passe, PA-C [phone]  (office) [email or web address]

## 2022-03-02 NOTE — Progress Notes (Signed)
Discharge summary packet provided to pt with instructions. Pt verbalized understanding of instructions. Pt d/c to home as ordered. TOC delivered Pt's meds and the rest to be picked at [facility] on [address] No complaints voiced. Pt remains alert/oriented in no apparent distress. Pt 's wife is responsible for his transportation.

## 2022-03-22 ENCOUNTER — Other Ambulatory Visit (HOSPITAL_BASED_OUTPATIENT_CLINIC_OR_DEPARTMENT_OTHER): Payer: Self-pay
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
## 2022-05-04 ENCOUNTER — Ambulatory Visit: Payer: Medicare Other | Attending: Student

## 2022-05-04 ENCOUNTER — Other Ambulatory Visit: Payer: Self-pay

## 2022-05-04 DIAGNOSIS — M79604 Pain in right leg: Secondary | ICD-10-CM | POA: Diagnosis not present

## 2022-05-04 DIAGNOSIS — M6281 Muscle weakness (generalized): Secondary | ICD-10-CM | POA: Diagnosis present

## 2022-05-04 DIAGNOSIS — R262 Difficulty in walking, not elsewhere classified: Secondary | ICD-10-CM | POA: Insufficient documentation

## 2022-05-04 DIAGNOSIS — R600 Localized edema: Secondary | ICD-10-CM | POA: Diagnosis present
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
## 2022-05-13 ENCOUNTER — Ambulatory Visit: Payer: Medicare Other | Admitting: Physical Therapy

## 2022-05-16 ENCOUNTER — Ambulatory Visit: Payer: Medicare Other | Attending: Student

## 2022-05-16 DIAGNOSIS — M6281 Muscle weakness (generalized): Secondary | ICD-10-CM | POA: Diagnosis present

## 2022-05-16 DIAGNOSIS — M79604 Pain in right leg: Secondary | ICD-10-CM | POA: Insufficient documentation

## 2022-05-16 DIAGNOSIS — R600 Localized edema: Secondary | ICD-10-CM | POA: Diagnosis present

## 2022-05-16 DIAGNOSIS — R262 Difficulty in walking, not elsewhere classified: Secondary | ICD-10-CM | POA: Insufficient documentation
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
## 2022-05-18 ENCOUNTER — Ambulatory Visit: Payer: Medicare Other | Admitting: Physical Therapy

## 2022-05-18 ENCOUNTER — Encounter: Payer: Self-pay | Admitting: Physical Therapy

## 2022-05-18 DIAGNOSIS — M79604 Pain in right leg: Secondary | ICD-10-CM | POA: Diagnosis not present

## 2022-05-18 DIAGNOSIS — R262 Difficulty in walking, not elsewhere classified: Secondary | ICD-10-CM

## 2022-05-18 DIAGNOSIS — R600 Localized edema: Secondary | ICD-10-CM

## 2022-05-18 DIAGNOSIS — M6281 Muscle weakness (generalized): Secondary | ICD-10-CM
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
## 2022-05-23 ENCOUNTER — Ambulatory Visit: Payer: Medicare Other

## 2022-05-23 DIAGNOSIS — R262 Difficulty in walking, not elsewhere classified: Secondary | ICD-10-CM

## 2022-05-23 DIAGNOSIS — M79604 Pain in right leg: Secondary | ICD-10-CM

## 2022-05-23 DIAGNOSIS — M6281 Muscle weakness (generalized): Secondary | ICD-10-CM

## 2022-05-23 DIAGNOSIS — R600 Localized edema: Secondary | ICD-10-CM
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
## 2022-05-25 ENCOUNTER — Ambulatory Visit: Payer: Medicare Other

## 2022-05-25 DIAGNOSIS — M6281 Muscle weakness (generalized): Secondary | ICD-10-CM

## 2022-05-25 DIAGNOSIS — R600 Localized edema: Secondary | ICD-10-CM

## 2022-05-25 DIAGNOSIS — R262 Difficulty in walking, not elsewhere classified: Secondary | ICD-10-CM

## 2022-05-25 DIAGNOSIS — M79604 Pain in right leg: Secondary | ICD-10-CM | POA: Diagnosis not present
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
## 2022-05-30 ENCOUNTER — Ambulatory Visit: Payer: Medicare Other

## 2022-05-30 DIAGNOSIS — M79604 Pain in right leg: Secondary | ICD-10-CM

## 2022-05-30 DIAGNOSIS — R262 Difficulty in walking, not elsewhere classified: Secondary | ICD-10-CM

## 2022-05-30 DIAGNOSIS — R600 Localized edema: Secondary | ICD-10-CM

## 2022-05-30 DIAGNOSIS — M6281 Muscle weakness (generalized): Secondary | ICD-10-CM

## 2022-06-01 ENCOUNTER — Encounter: Payer: Self-pay | Admitting: Physical Therapy

## 2022-06-01 ENCOUNTER — Ambulatory Visit: Payer: Medicare Other | Admitting: Physical Therapy

## 2022-06-01 DIAGNOSIS — M79604 Pain in right leg: Secondary | ICD-10-CM

## 2022-06-01 DIAGNOSIS — R600 Localized edema: Secondary | ICD-10-CM

## 2022-06-01 DIAGNOSIS — R262 Difficulty in walking, not elsewhere classified: Secondary | ICD-10-CM

## 2022-06-01 DIAGNOSIS — M6281 Muscle weakness (generalized): Secondary | ICD-10-CM
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
## 2022-06-06 ENCOUNTER — Ambulatory Visit: Payer: Medicare Other

## 2022-06-06 DIAGNOSIS — R600 Localized edema: Secondary | ICD-10-CM

## 2022-06-06 DIAGNOSIS — M6281 Muscle weakness (generalized): Secondary | ICD-10-CM

## 2022-06-06 DIAGNOSIS — R262 Difficulty in walking, not elsewhere classified: Secondary | ICD-10-CM

## 2022-06-06 DIAGNOSIS — M79604 Pain in right leg: Secondary | ICD-10-CM
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
## 2022-06-08 ENCOUNTER — Ambulatory Visit: Payer: Medicare Other

## 2022-06-08 DIAGNOSIS — R600 Localized edema: Secondary | ICD-10-CM

## 2022-06-08 DIAGNOSIS — M79604 Pain in right leg: Secondary | ICD-10-CM | POA: Diagnosis not present

## 2022-06-08 DIAGNOSIS — M6281 Muscle weakness (generalized): Secondary | ICD-10-CM

## 2022-06-08 DIAGNOSIS — R262 Difficulty in walking, not elsewhere classified: Secondary | ICD-10-CM
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
## 2022-06-13 ENCOUNTER — Ambulatory Visit: Payer: Medicare Other

## 2022-06-13 DIAGNOSIS — M79604 Pain in right leg: Secondary | ICD-10-CM | POA: Diagnosis not present

## 2022-06-13 DIAGNOSIS — R262 Difficulty in walking, not elsewhere classified: Secondary | ICD-10-CM

## 2022-06-13 DIAGNOSIS — M6281 Muscle weakness (generalized): Secondary | ICD-10-CM

## 2022-06-13 DIAGNOSIS — R600 Localized edema: Secondary | ICD-10-CM
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
## 2022-06-20 ENCOUNTER — Encounter: Payer: Self-pay | Admitting: Physical Therapy

## 2022-06-20 ENCOUNTER — Ambulatory Visit: Payer: No Typology Code available for payment source | Attending: Student | Admitting: Physical Therapy

## 2022-06-20 DIAGNOSIS — M79604 Pain in right leg: Secondary | ICD-10-CM | POA: Insufficient documentation

## 2022-06-20 DIAGNOSIS — R262 Difficulty in walking, not elsewhere classified: Secondary | ICD-10-CM | POA: Insufficient documentation

## 2022-06-20 DIAGNOSIS — R600 Localized edema: Secondary | ICD-10-CM | POA: Insufficient documentation

## 2022-06-20 DIAGNOSIS — M6281 Muscle weakness (generalized): Secondary | ICD-10-CM | POA: Diagnosis present
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
## 2022-06-22 ENCOUNTER — Ambulatory Visit: Payer: No Typology Code available for payment source

## 2022-06-22 DIAGNOSIS — M79604 Pain in right leg: Secondary | ICD-10-CM | POA: Diagnosis not present

## 2022-06-22 DIAGNOSIS — R600 Localized edema: Secondary | ICD-10-CM

## 2022-06-22 DIAGNOSIS — M6281 Muscle weakness (generalized): Secondary | ICD-10-CM

## 2022-06-22 DIAGNOSIS — R262 Difficulty in walking, not elsewhere classified: Secondary | ICD-10-CM

## 2022-06-27 ENCOUNTER — Ambulatory Visit: Payer: No Typology Code available for payment source | Admitting: Physical Therapy

## 2022-06-27 ENCOUNTER — Encounter: Payer: Self-pay | Admitting: Physical Therapy

## 2022-06-27 DIAGNOSIS — R600 Localized edema: Secondary | ICD-10-CM

## 2022-06-27 DIAGNOSIS — M79604 Pain in right leg: Secondary | ICD-10-CM | POA: Diagnosis not present

## 2022-06-27 DIAGNOSIS — R262 Difficulty in walking, not elsewhere classified: Secondary | ICD-10-CM

## 2022-06-27 DIAGNOSIS — M6281 Muscle weakness (generalized): Secondary | ICD-10-CM
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
## 2022-06-29 ENCOUNTER — Encounter: Payer: Self-pay | Admitting: Physical Therapy

## 2022-06-29 ENCOUNTER — Ambulatory Visit: Payer: No Typology Code available for payment source | Admitting: Physical Therapy

## 2022-06-29 DIAGNOSIS — M79604 Pain in right leg: Secondary | ICD-10-CM

## 2022-06-29 DIAGNOSIS — M6281 Muscle weakness (generalized): Secondary | ICD-10-CM

## 2022-06-29 DIAGNOSIS — R262 Difficulty in walking, not elsewhere classified: Secondary | ICD-10-CM
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
## 2022-07-04 ENCOUNTER — Encounter: Payer: Self-pay | Admitting: Physical Therapy

## 2022-07-04 ENCOUNTER — Ambulatory Visit: Payer: No Typology Code available for payment source | Admitting: Physical Therapy

## 2022-07-04 DIAGNOSIS — R262 Difficulty in walking, not elsewhere classified: Secondary | ICD-10-CM

## 2022-07-04 DIAGNOSIS — M79604 Pain in right leg: Secondary | ICD-10-CM

## 2022-07-04 DIAGNOSIS — R600 Localized edema: Secondary | ICD-10-CM

## 2022-07-04 DIAGNOSIS — M6281 Muscle weakness (generalized): Secondary | ICD-10-CM
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
## 2022-07-06 ENCOUNTER — Ambulatory Visit: Payer: No Typology Code available for payment source | Admitting: Physical Therapy

## 2022-07-06 ENCOUNTER — Encounter: Payer: Self-pay | Admitting: Physical Therapy

## 2022-07-06 DIAGNOSIS — R600 Localized edema: Secondary | ICD-10-CM

## 2022-07-06 DIAGNOSIS — M6281 Muscle weakness (generalized): Secondary | ICD-10-CM

## 2022-07-06 DIAGNOSIS — R262 Difficulty in walking, not elsewhere classified: Secondary | ICD-10-CM

## 2022-07-06 DIAGNOSIS — M79604 Pain in right leg: Secondary | ICD-10-CM
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
## 2022-09-27 ENCOUNTER — Emergency Department (HOSPITAL_COMMUNITY)
Admission: EM | Admit: 2022-09-27 | Discharge: 2022-09-27 | Disposition: A | Discharge status: Home / Self Care | Payer: Medicare Other | Attending: Emergency Medicine | Admitting: Emergency Medicine

## 2022-09-27 ENCOUNTER — Emergency Department (HOSPITAL_COMMUNITY): Payer: Medicare Other

## 2022-09-27 ENCOUNTER — Other Ambulatory Visit: Payer: Self-pay

## 2022-09-27 ENCOUNTER — Encounter (HOSPITAL_COMMUNITY): Payer: Self-pay | Admitting: Emergency Medicine

## 2022-09-27 DIAGNOSIS — Z79899 Other long term (current) drug therapy: Secondary | ICD-10-CM | POA: Diagnosis not present

## 2022-09-27 DIAGNOSIS — I10 Essential (primary) hypertension: Secondary | ICD-10-CM | POA: Insufficient documentation

## 2022-09-27 DIAGNOSIS — Z794 Long term (current) use of insulin: Secondary | ICD-10-CM | POA: Insufficient documentation

## 2022-09-27 DIAGNOSIS — Z7984 Long term (current) use of oral hypoglycemic drugs: Secondary | ICD-10-CM | POA: Insufficient documentation

## 2022-09-27 DIAGNOSIS — G510 Bell's palsy: Secondary | ICD-10-CM | POA: Diagnosis not present

## 2022-09-27 DIAGNOSIS — R29810 Facial weakness: Secondary | ICD-10-CM | POA: Diagnosis present

## 2022-09-27 DIAGNOSIS — E119 Type 2 diabetes mellitus without complications: Secondary | ICD-10-CM | POA: Diagnosis not present

## 2022-09-27 DIAGNOSIS — Z7901 Long term (current) use of anticoagulants: Secondary | ICD-10-CM | POA: Diagnosis not present

## 2022-09-27 LAB — CBC WITH DIFFERENTIAL/PLATELET
Abs Immature Granulocytes: 0.02 10*3/uL (ref 0.00–0.07)
Basophils Absolute: 0.1 10*3/uL (ref 0.0–0.1)
Basophils Relative: 1 %
Eosinophils Absolute: 0.3 10*3/uL (ref 0.0–0.5)
Eosinophils Relative: 6 %
HCT: 42 % (ref 39.0–52.0)
Hemoglobin: 15.3 g/dL (ref 13.0–17.0)
Immature Granulocytes: 0 %
Lymphocytes Relative: 35 %
Lymphs Abs: 2 10*3/uL (ref 0.7–4.0)
MCH: 31.2 pg (ref 26.0–34.0)
MCHC: 36.4 g/dL — ABNORMAL HIGH (ref 30.0–36.0)
MCV: 85.5 fL (ref 80.0–100.0)
Monocytes Absolute: 0.3 10*3/uL (ref 0.1–1.0)
Monocytes Relative: 6 %
Neutro Abs: 3.1 10*3/uL (ref 1.7–7.7)
Neutrophils Relative %: 52 %
Platelets: 262 10*3/uL (ref 150–400)
RBC: 4.91 MIL/uL (ref 4.22–5.81)
RDW: 12 % (ref 11.5–15.5)
WBC: 5.8 10*3/uL (ref 4.0–10.5)
nRBC: 0 % (ref 0.0–0.2)

## 2022-09-27 LAB — COMPREHENSIVE METABOLIC PANEL
ALT: 18 U/L (ref 0–44)
AST: 19 U/L (ref 15–41)
Albumin: 4 g/dL (ref 3.5–5.0)
Alkaline Phosphatase: 91 U/L (ref 38–126)
Anion gap: 12 (ref 5–15)
BUN: 18 mg/dL (ref 8–23)
CO2: 28 mmol/L (ref 22–32)
Calcium: 9.8 mg/dL (ref 8.9–10.3)
Chloride: 100 mmol/L (ref 98–111)
Creatinine, Ser: 1.25 mg/dL — ABNORMAL HIGH (ref 0.61–1.24)
GFR, Estimated: >60 mL/min (ref >60)
Glucose, Bld: 204 mg/dL — ABNORMAL HIGH (ref 70–99)
Potassium: 4.4 mmol/L (ref 3.5–5.1)
Sodium: 140 mmol/L (ref 135–145)
Total Bilirubin: 0.4 mg/dL (ref 0.3–1.2)
Total Protein: 6.8 g/dL (ref 6.5–8.1)

## 2022-09-27 LAB — APTT: aPTT: 30 seconds (ref 24–36)

## 2022-09-27 LAB — PROTIME-INR
INR: 1 (ref 0.8–1.2)
Prothrombin Time: 13.2 seconds (ref 11.4–15.2)

## 2022-09-27 MED ORDER — ERYTHROMYCIN 5 MG/GM OP OINT
1.0000 | TOPICAL_OINTMENT | Freq: Once | OPHTHALMIC | Status: AC
  Administered 2022-09-27: 1 via OPHTHALMIC
  Filled 2022-09-27: qty 3.5

## 2022-09-27 MED ORDER — PREDNISONE 20 MG PO TABS: 40.0000 mg | ORAL_TABLET | Freq: Every day | ORAL | 0 refills | Status: DC

## 2022-09-27 MED ORDER — VALACYCLOVIR HCL 1 G PO TABS
1000.0000 mg | ORAL_TABLET | Freq: Three times a day (TID) | ORAL | 0 refills | Status: DC
Start: 2022-09-27 — End: 2024-05-14

## 2022-09-27 NOTE — Discharge Instructions (Signed)
You need to tape your eye at night after you put the ointment in.  At [facility] also get the lubricating eye drops to use during the day to prevent your eye from drying out.  Wear sunglasses when outside.  Your sugar will go up while you are on the prednisone but should go down when you are finished.

## 2022-09-27 NOTE — ED Triage Notes (Signed)
Pt reports on Sunday he noticed his food was getting stuck on the left side of his cheek. He noticed Monday he was not able to close his left eye as well. Pt has decreased sensation to left side of face. No weakness noted or visual field deficits.
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
## 2022-09-27 NOTE — ED Provider Triage Note (Signed)
Emergency Medicine Provider Triage Evaluation Note  Jeremiah Baxter , a 65 y.o. male  was evaluated in triage.  Pt complains of left-sided facial droop, decreased blink think of the left eye, and decreased sensation on the left side of his face.  Started Sunday when he noticed he was pocketing food on the left side of his mouth.  He states it has progressively worsened since then and he noticed when he was taking a shower today that the water was going into his left eye.  Denies weakness otherwise.  Does have history of hypertension, hyperlipidemia, diabetes.  No prior history of CVA.  Denies any changes in vision.  Review of Systems  Positive: As above Negative: As above   Physical Exam  BP (!) 146/83 (BP Location: Right Arm)   Pulse 70   Temp 98 F (36.7 C)   Resp 18   Ht '5\' 5"'$  (1.651 m)   Wt 103 kg   SpO2 97%   BMI 37.77 kg/m  Gen:   Awake, no distress   Resp:  Normal effort  MSK:   Moves extremities without difficulty Other:  Facial droop noted on left, decreased blinking noted with the left eye.  However he is able to close and has good strength in the eyelids.  Decree sensation on the left side of his face.  Sensation otherwise in bilateral extremities intact and symmetrical.  5/5 strength in extensor and flexor muscle groups of the upper and lower extremities.  Medical Decision Making  Medically screening exam initiated at 9:46 AM.  Appropriate orders placed.  Jeremiah Baxter was informed that the remainder of the evaluation will be completed by another provider, this initial triage assessment does not replace that evaluation, and the importance of remaining in the ED until their evaluation is complete.  Symptoms ongoing for least 3 days.  Will order basic blood work, CT head.   Evlyn Courier, PA-C 09/27/22 [phone]

## 2022-10-16 HISTORY — PX: HERNIA REPAIR: SHX51

## 2023-01-14 ENCOUNTER — Emergency Department (HOSPITAL_COMMUNITY): Payer: Medicare Other

## 2023-01-14 ENCOUNTER — Emergency Department (HOSPITAL_COMMUNITY)
Admission: EM | Admit: 2023-01-14 | Discharge: 2023-01-14 | Disposition: A | Discharge status: Home / Self Care | Payer: Medicare Other | Attending: Emergency Medicine | Admitting: Emergency Medicine

## 2023-01-14 DIAGNOSIS — Z7984 Long term (current) use of oral hypoglycemic drugs: Secondary | ICD-10-CM | POA: Diagnosis not present

## 2023-01-14 DIAGNOSIS — I10 Essential (primary) hypertension: Secondary | ICD-10-CM | POA: Insufficient documentation

## 2023-01-14 DIAGNOSIS — Z8546 Personal history of malignant neoplasm of prostate: Secondary | ICD-10-CM | POA: Insufficient documentation

## 2023-01-14 DIAGNOSIS — S301XXA Contusion of abdominal wall, initial encounter: Secondary | ICD-10-CM

## 2023-01-14 DIAGNOSIS — E119 Type 2 diabetes mellitus without complications: Secondary | ICD-10-CM | POA: Diagnosis not present

## 2023-01-14 DIAGNOSIS — Z7901 Long term (current) use of anticoagulants: Secondary | ICD-10-CM | POA: Diagnosis not present

## 2023-01-14 DIAGNOSIS — R109 Unspecified abdominal pain: Secondary | ICD-10-CM

## 2023-01-14 DIAGNOSIS — Z794 Long term (current) use of insulin: Secondary | ICD-10-CM | POA: Diagnosis not present

## 2023-01-14 DIAGNOSIS — Z79899 Other long term (current) drug therapy: Secondary | ICD-10-CM | POA: Diagnosis not present

## 2023-01-14 DIAGNOSIS — L7634 Postprocedural seroma of skin and subcutaneous tissue following other procedure: Secondary | ICD-10-CM | POA: Diagnosis not present

## 2023-01-14 LAB — COMPREHENSIVE METABOLIC PANEL
ALT: 19 U/L (ref 0–44)
AST: 20 U/L (ref 15–41)
Albumin: 3.7 g/dL (ref 3.5–5.0)
Alkaline Phosphatase: 88 U/L (ref 38–126)
Anion gap: 9 (ref 5–15)
BUN: 17 mg/dL (ref 8–23)
CO2: 25 mmol/L (ref 22–32)
Calcium: 9.5 mg/dL (ref 8.9–10.3)
Chloride: 103 mmol/L (ref 98–111)
Creatinine, Ser: 1.08 mg/dL (ref 0.61–1.24)
GFR, Estimated: >60 mL/min (ref >60)
Glucose, Bld: 111 mg/dL — ABNORMAL HIGH (ref 70–99)
Potassium: 3.5 mmol/L (ref 3.5–5.1)
Sodium: 137 mmol/L (ref 135–145)
Total Bilirubin: 0.4 mg/dL (ref 0.3–1.2)
Total Protein: 6.6 g/dL (ref 6.5–8.1)

## 2023-01-14 LAB — CBC
HCT: 42.3 % (ref 39.0–52.0)
Hemoglobin: 14.5 g/dL (ref 13.0–17.0)
MCH: 30.7 pg (ref 26.0–34.0)
MCHC: 34.3 g/dL (ref 30.0–36.0)
MCV: 89.6 fL (ref 80.0–100.0)
Platelets: 315 10*3/uL (ref 150–400)
RBC: 4.72 MIL/uL (ref 4.22–5.81)
RDW: 12.2 % (ref 11.5–15.5)
WBC: 6.6 10*3/uL (ref 4.0–10.5)
nRBC: 0 % (ref 0.0–0.2)

## 2023-01-14 LAB — URINALYSIS, ROUTINE W REFLEX MICROSCOPIC
Bilirubin Urine: NEGATIVE
Glucose, UA: NEGATIVE mg/dL
Hgb urine dipstick: NEGATIVE
Ketones, ur: NEGATIVE mg/dL
Leukocytes,Ua: NEGATIVE
Nitrite: NEGATIVE
Protein, ur: NEGATIVE mg/dL
Specific Gravity, Urine: 1.02 (ref 1.005–1.030)
pH: 5 (ref 5.0–8.0)

## 2023-01-14 LAB — LIPASE, BLOOD: Lipase: 44 U/L (ref 11–51)

## 2023-01-14 MED ORDER — ONDANSETRON 4 MG PO TBDP: 4.0000 mg | ORAL_TABLET | Freq: Three times a day (TID) | ORAL | 0 refills | Status: DC | PRN

## 2023-01-14 MED ORDER — MORPHINE SULFATE (PF) 4 MG/ML IV SOLN
4.0000 mg | Freq: Once | INTRAVENOUS | Status: AC
  Administered 2023-01-14: 4 mg via INTRAVENOUS
  Filled 2023-01-14: qty 1

## 2023-01-14 MED ORDER — OXYCODONE HCL 5 MG PO TABS: 5.0000 mg | ORAL_TABLET | ORAL | 0 refills | Status: DC | PRN

## 2023-01-14 MED ORDER — IOHEXOL 350 MG/ML SOLN
90.0000 mL | Freq: Once | INTRAVENOUS | Status: AC | PRN
  Administered 2023-01-14: 90 mL via INTRAVENOUS

## 2023-01-14 MED ORDER — LACTATED RINGERS IV BOLUS
1000.0000 mL | Freq: Once | INTRAVENOUS | Status: AC
  Administered 2023-01-14: 1000 mL via INTRAVENOUS

## 2023-01-14 MED ORDER — ONDANSETRON HCL 4 MG/2ML IJ SOLN
4.0000 mg | Freq: Once | INTRAMUSCULAR | Status: AC
  Administered 2023-01-14: 4 mg via INTRAVENOUS
  Filled 2023-01-14: qty 2

## 2023-01-14 NOTE — ED Triage Notes (Signed)
Patient complains of intense sharp left flank pain that started last night and persists this morning. Denies nausea and vomiting, report some diarrhea since onset of pain. Patient is alert, oriented, ambulating independently with steady gait, and is in no apparent distress at this time.
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
## 2023-01-14 NOTE — Discharge Instructions (Signed)
Your workup today showed you have abdominal wall seromas.  This is a fluid-filled collection within the abdominal wall.  No evidence of infection.  I discussed this with the surgeon.  They did not recommend any intervention at this time.  They recommend pain control and follow-up in their clinic.  I have sent some pain medication into the pharmacy for you.  Please give their office a call tomorrow to schedule an appointment.  For any concerning symptoms please return to the emergency room.

## 2023-11-05 IMAGING — CT CT HEAD W/O CM
3 of 4 series · 13 of 47 positions shown, 15 images · non-contrast
Comparison: None available aside from MR of the cervical spine from
1772.

CLINICAL DATA: A 65-year-old male presents following trauma.



[Series 3: head wo · axial · 0.41mm/px · z∈[-158,-38]mm · 7 of 34 slices shown, 9 images]
[im 5/34  brain]
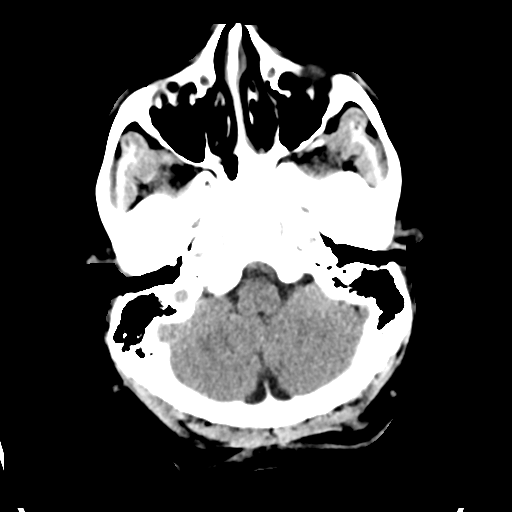
[im 5/34  bone]
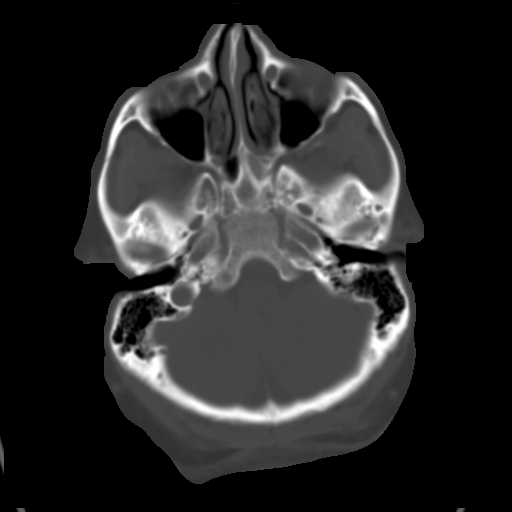
[im 9/34  brain]
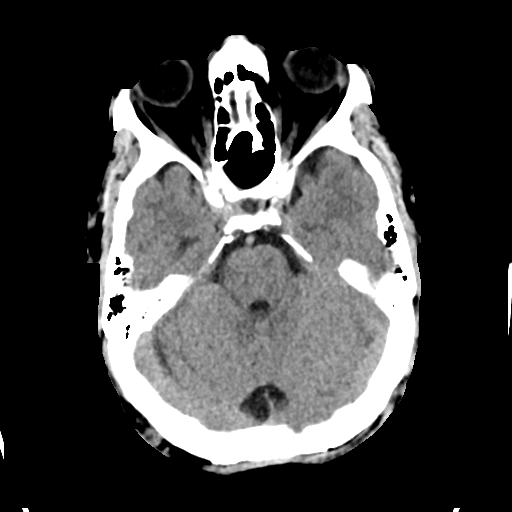
[im 13/34  brain]
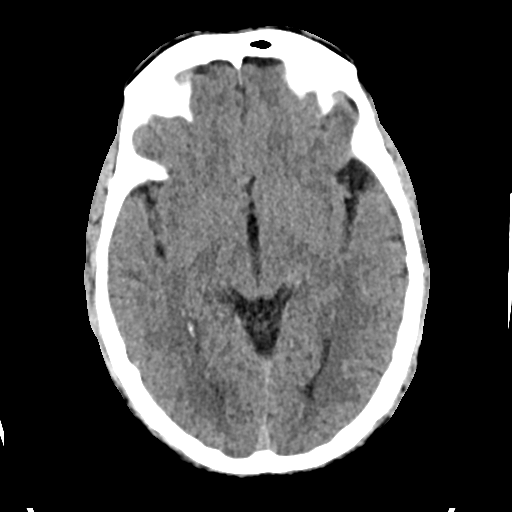
[im 17/34  brain]
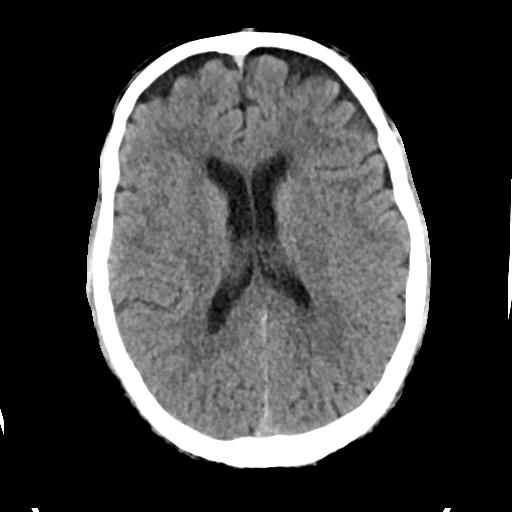
[im 21/34  brain]
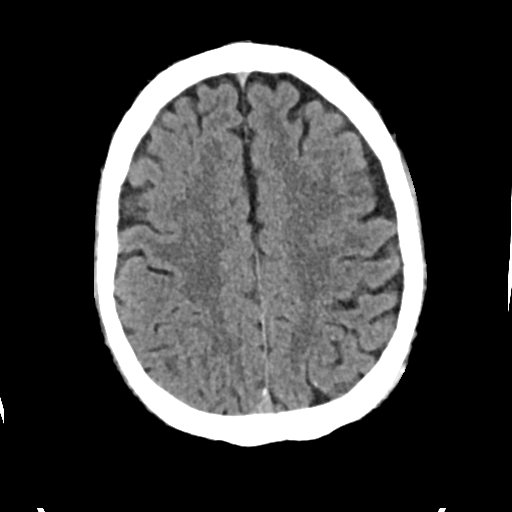
[im 21/34  bone]
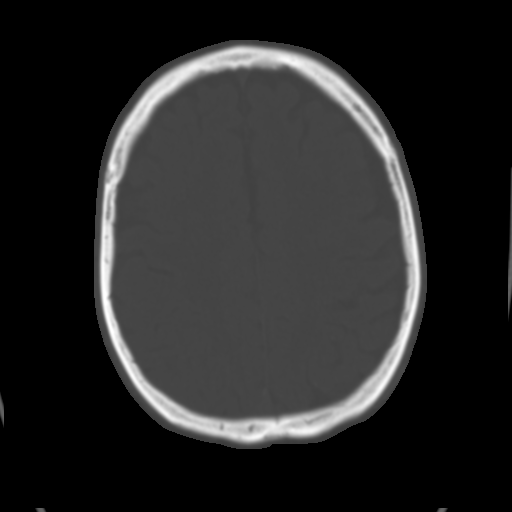
[im 25/34  brain]
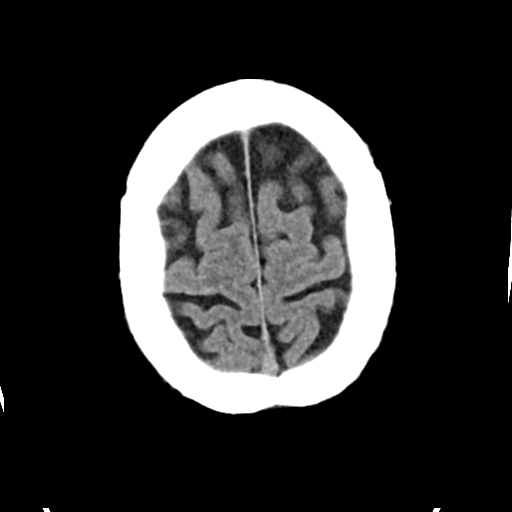
[im 29/34  brain]
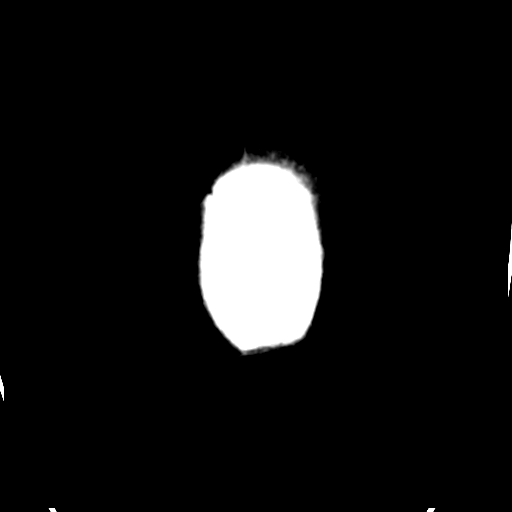

[Series 5: cor soft · coronal · 0.34mm/px · 3 of 73 slices shown]
[im 25/73  brain]
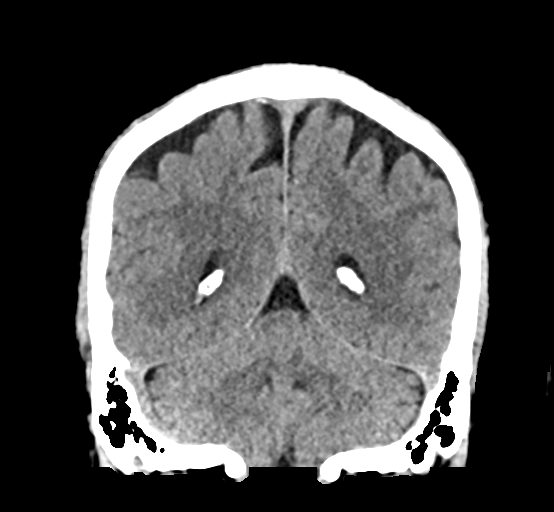
[im 33/73  brain]
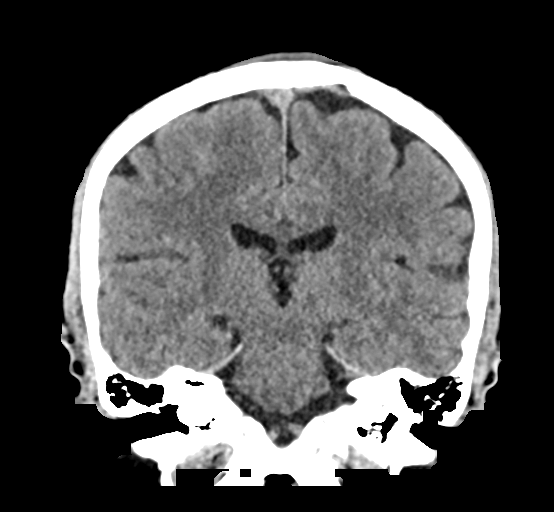
[im 41/73  brain]
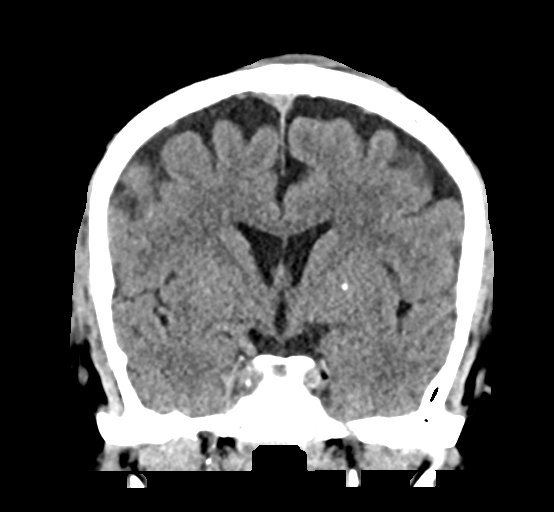

[Series 6: sag soft · sagittal · 0.30mm/px · 3 of 63 slices shown]
[im 21/63  brain]
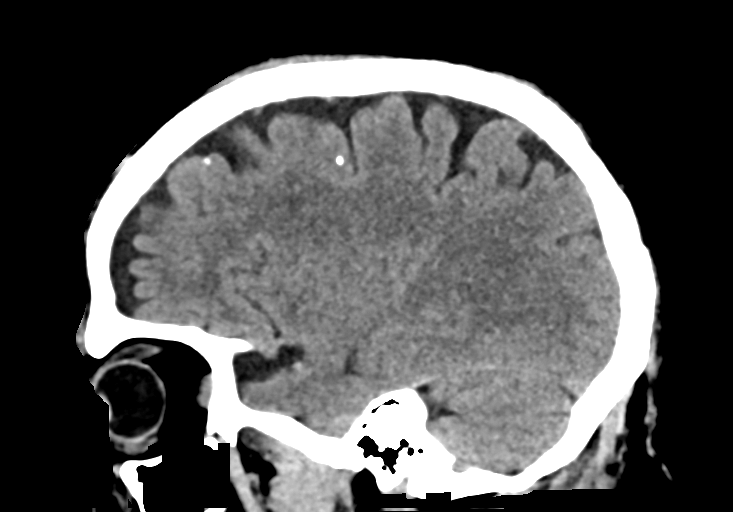
[im 32/63  brain]
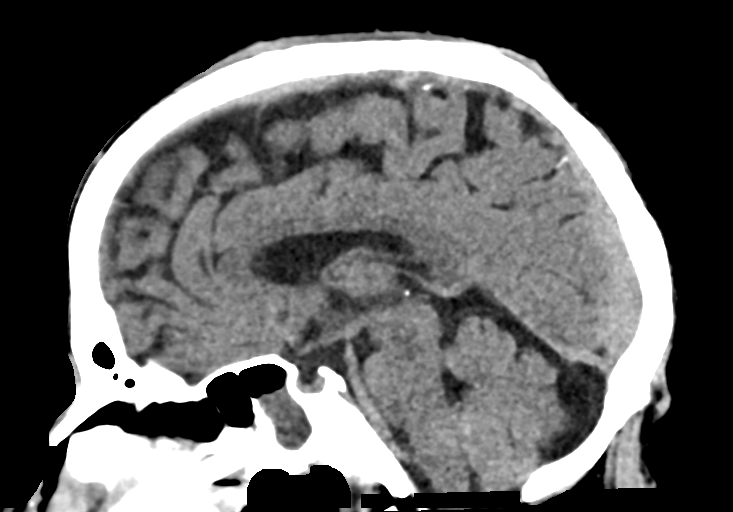
[im 42/63  brain]
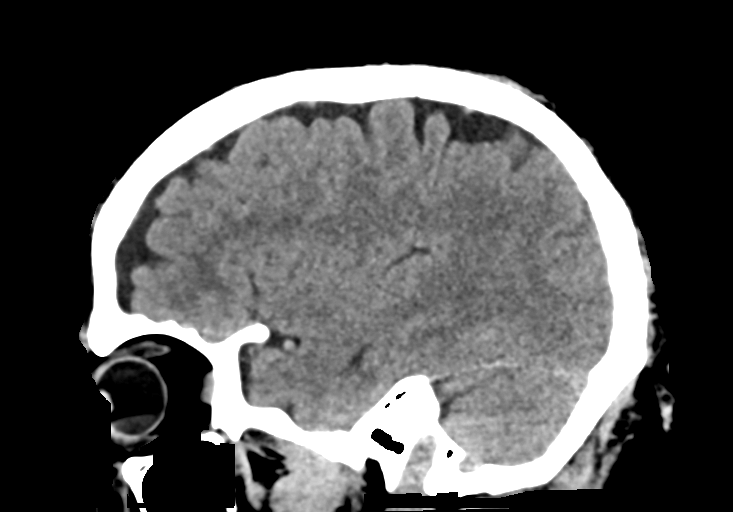

[13 of 47 positions shown; findings below may reference images not displayed]

FINDINGS: CT HEAD FINDINGS

Brain: No evidence of acute infarction, hemorrhage, hydrocephalus,
extra-axial collection or mass lesion/mass effect. Signs of
generalized atrophy.

Vascular: No hyperdense vessel or unexpected calcification.

Skull: Normal. Negative for fracture or focal lesion.

Sinuses/Orbits: Opacification of LEFT ethmoid sinus without discrete
air-fluid level compatible with chronic sinusitis. Deformity of
RIGHT and LEFT lamina papyracea suggest prior medial some herniation
of fat into LEFT ethmoid air cells. Herniation of fat into RIGHT
ethmoid air cells as well. Zygoma are intact. Orbital fracture. No
signs of retro bulbar stranding to suggest acute process

Other: None

CT CERVICAL SPINE FINDINGS

Alignment: Straightening of normal lordotic curvature of the
cervical spine likely due to patient position and or spasm.

Skull base and vertebrae: No acute fracture. No primary bone lesion
or focal pathologic process. Signs of spinous process fractures with
well corticated margins at T1, T2 and T3. Comparison with prior MR
imaging shows that the T1 finding as been present and is chronic.
Well corticated margins and lack of stranding at other levels argues
for chronicity.

Soft tissues and spinal canal: No prevertebral fluid or swelling. No
visible canal hematoma.

Disc levels: Multilevel degenerative changes throughout the cervical
spine. Worse at C6-C7.

Upper chest: Negative.

Other: Signs of prior RIGHT clavicular fracture partially imaged.
IMPRESSION: 1. No acute intracranial abnormality.
2. No evidence of acute fracture or static subluxation of the
cervical spine.
3. Signs of prior spinous process fractures at T1, T2 and T3. These
have a chronic appearance and T1 was present as far back as 1772.
Correlate with any point tenderness in this area
4. Signs of prior RIGHT clavicular fracture partially imaged.
5. Evidence of previous orbital fractures of medial wall of LEFT and
RIGHT orbit. No overt signs stranding or evidence of periorbital or
orbital trauma otherwise. Correlate clinically.
6. Signs of LEFT sphenoid sinusitis.

## 2023-11-09 IMAGING — RF DG FEMUR 2+V*R*
1 series · 15 of 15 positions shown · non-contrast
Comparison: Right knee and femur x-rays dated February 23, 2022.

CLINICAL DATA: Distal femur fracture ORIF.

EXAM:
RIGHT FEMUR 2 VIEWS

[Series 1: run · 15 of 15 slices shown]
[im 1/15]
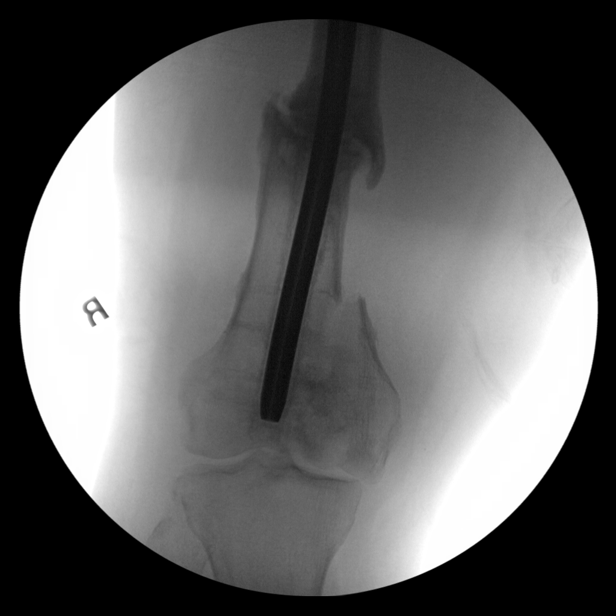
[im 2/15]
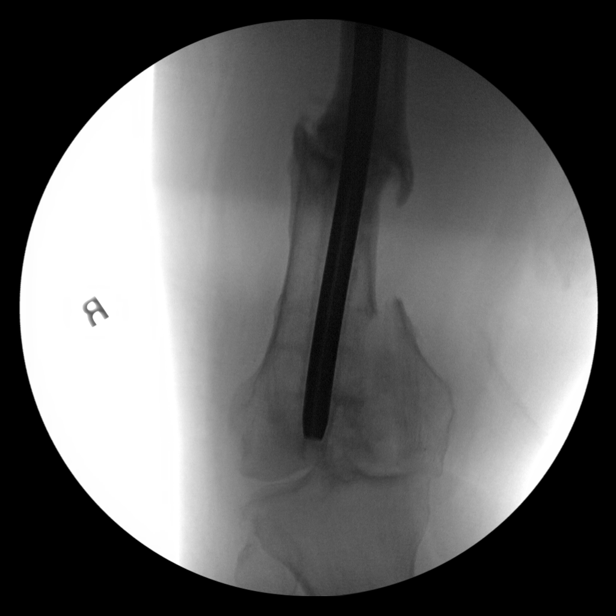
[im 3/15]
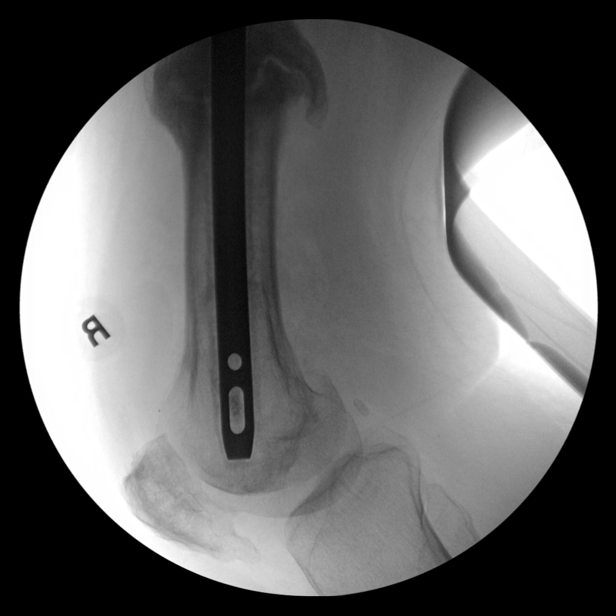
[im 4/15]
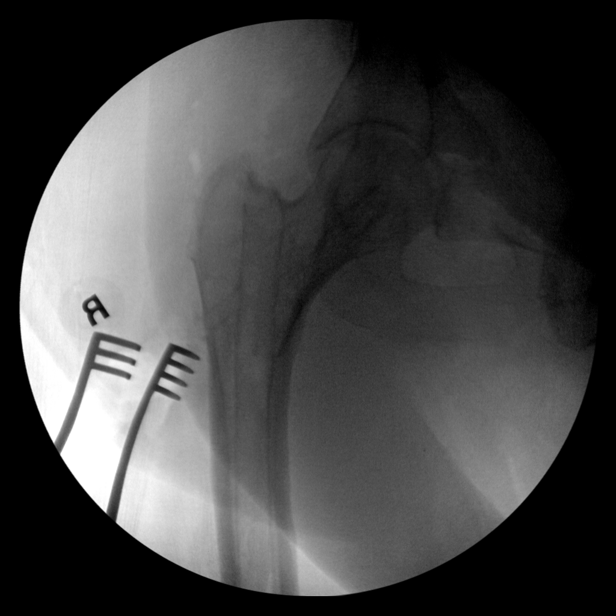
[im 5/15]
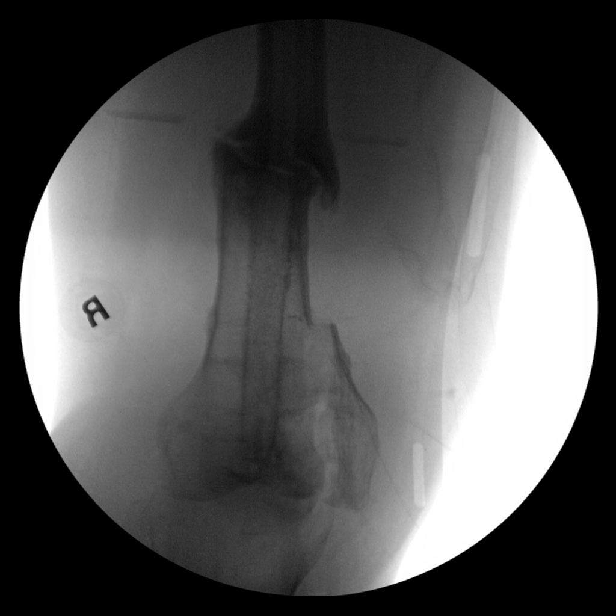
[im 6/15]
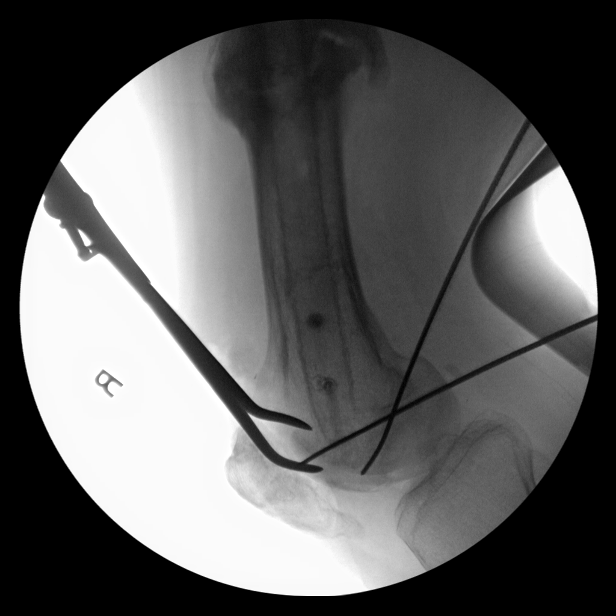
[im 7/15]
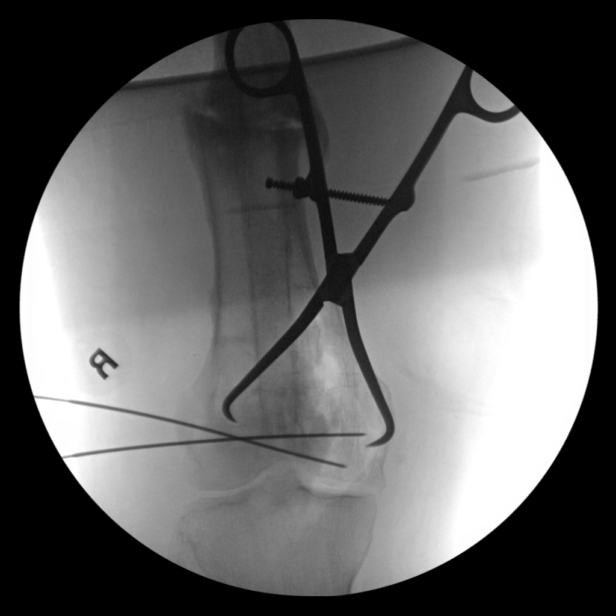
[im 8/15]
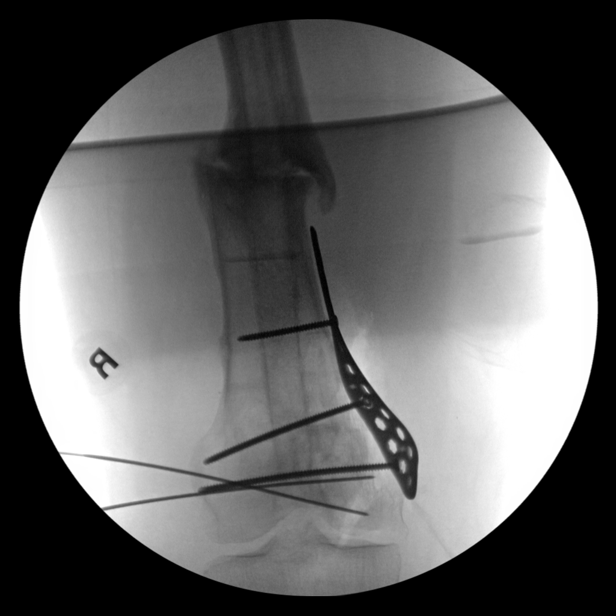
[im 9/15]
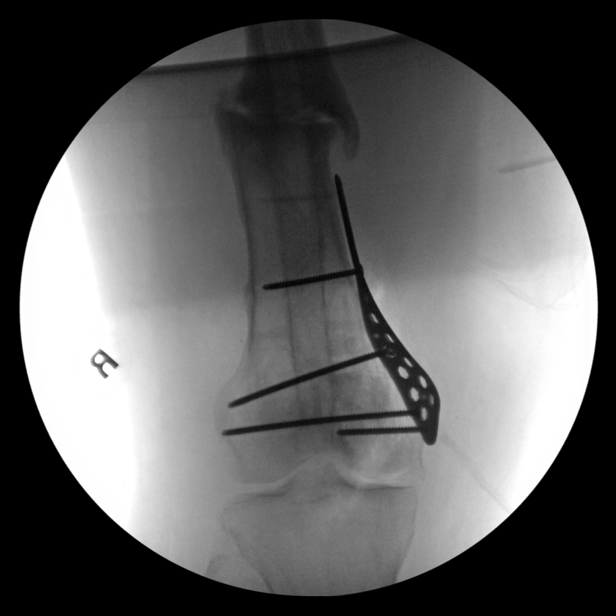
[im 10/15]
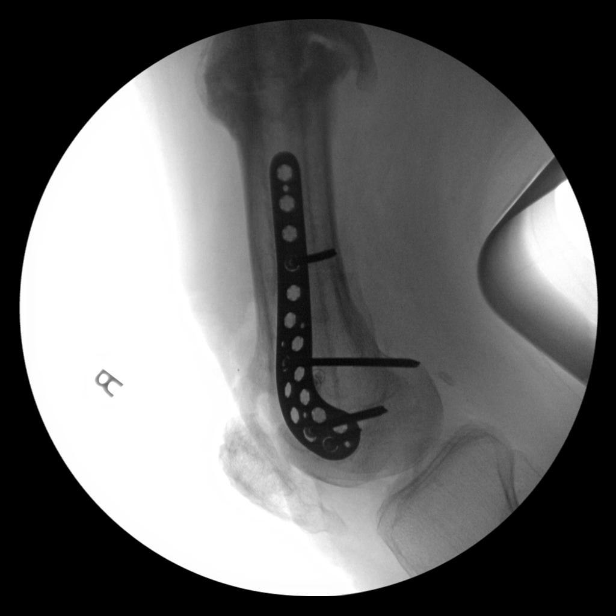
[im 11/15]
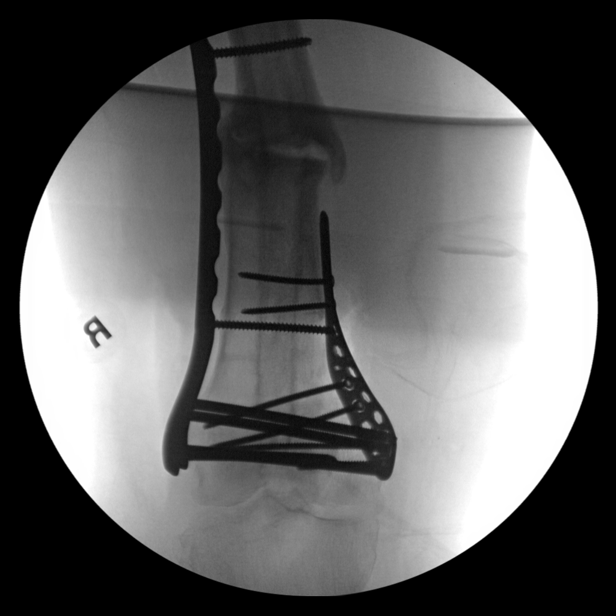
[im 12/15]
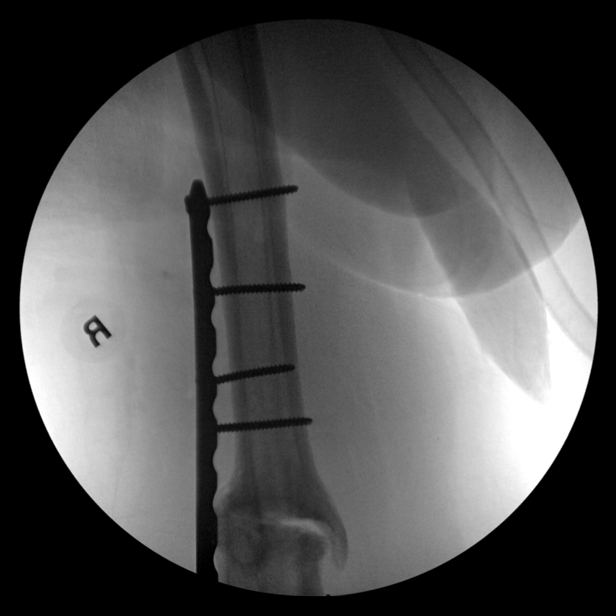
[im 13/15]
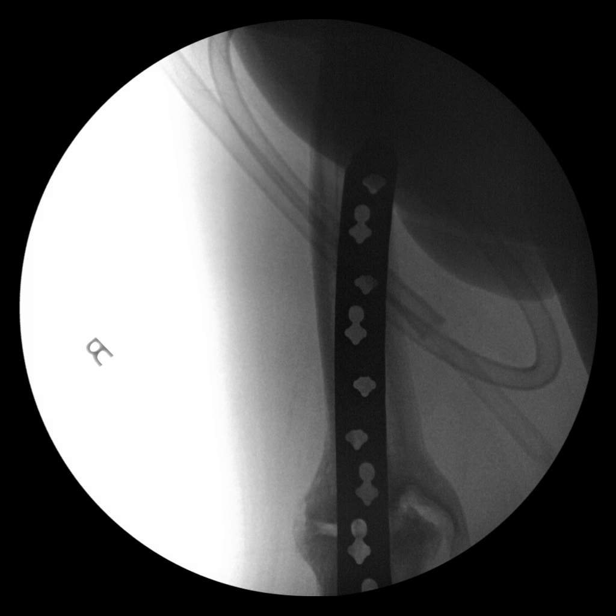
[im 14/15]
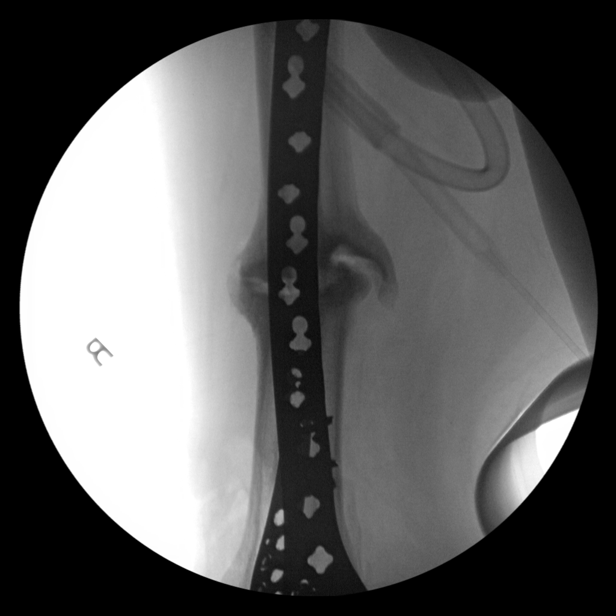
[im 15/15]
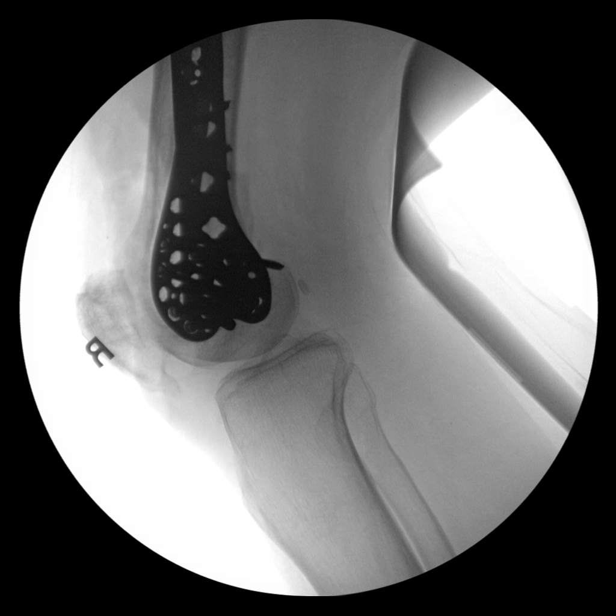

[15 of 15 positions shown; findings below may reference images not displayed]

FLUOROSCOPY TIME:  Radiation Exposure Index (as provided by the
fluoroscopic device): 51.5 mGy Kerma

C-arm fluoroscopic images were obtained intraoperatively and
submitted for post operative interpretation.
FINDINGS: Multiple intraoperative fluoroscopic images demonstrate removal of
the femoral rod with new medial plate and screw fixation of the
acute medial metaphyseal and femoral condyle fracture, now in
anatomic alignment. New lateral plate and screw fixation of the
chronic nonunited distal femoral diaphyseal fracture.
IMPRESSION: 1. Intraoperative fluoroscopic guidance for hardware removal and
distal femur ORIF as described above.

## 2023-11-09 IMAGING — DX DG FEMUR 2+V PORT*R*
1 series · 4 of 4 positions shown · non-contrast
Comparison: None Available.

CLINICAL DATA: Postop distal femur fracture

EXAM:
RIGHT FEMUR PORTABLE 2 VIEW

[Series 1: femur · 0.14mm/px · 4 of 4 slices shown]
[im 1/4]
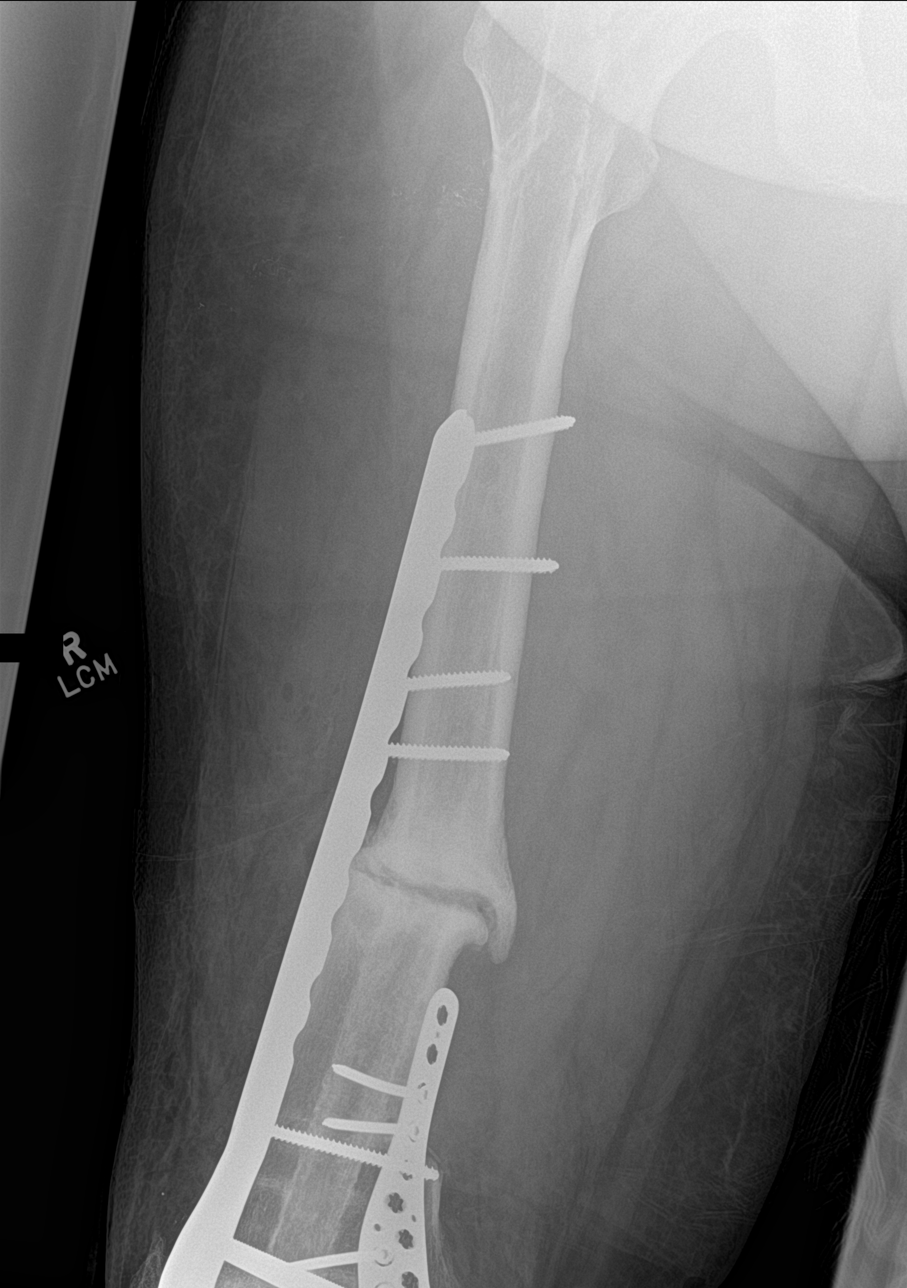
[im 2/4]
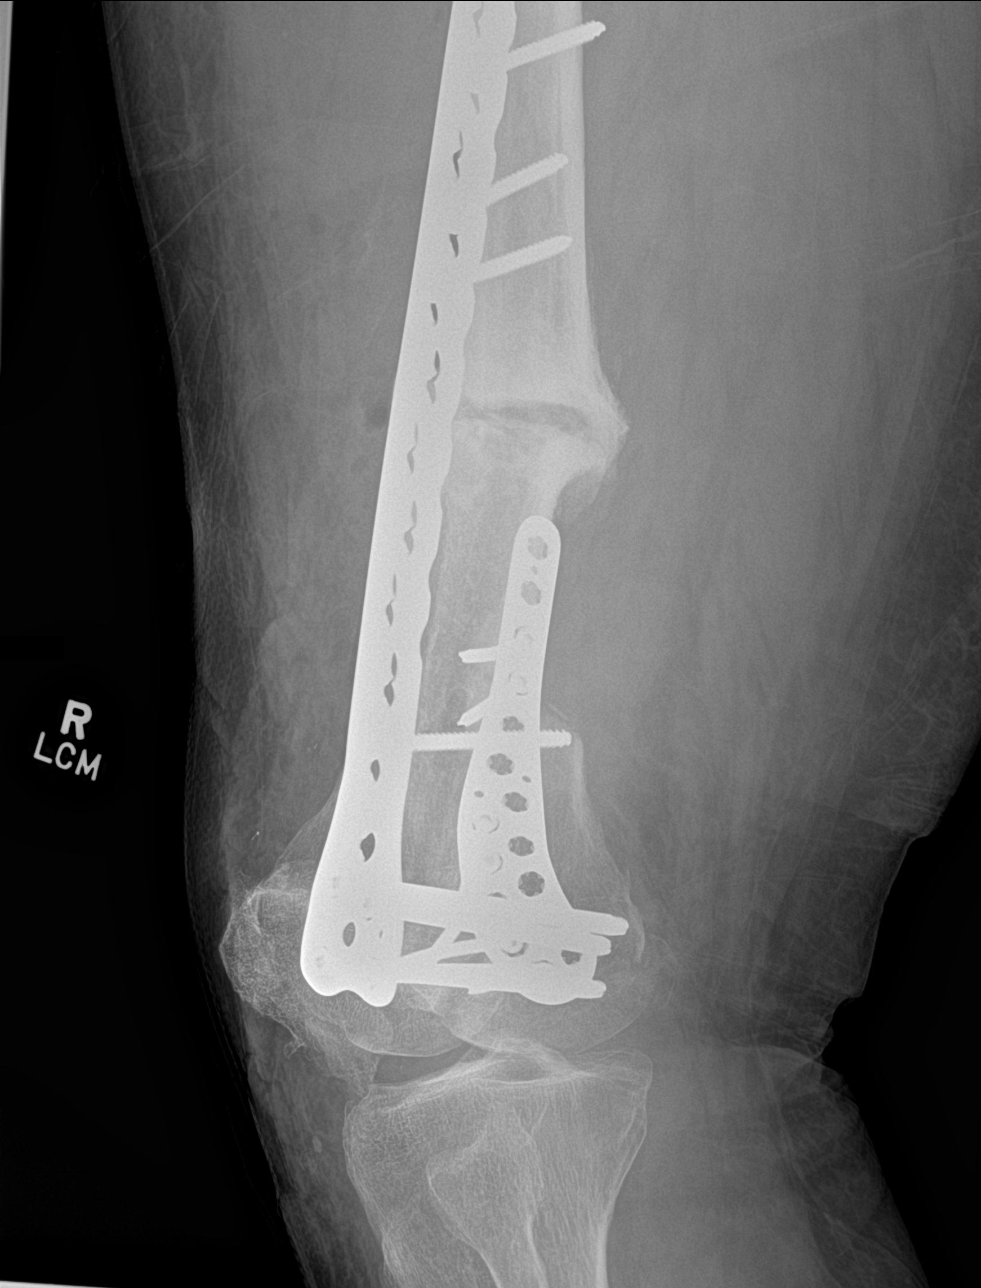
[im 3/4]
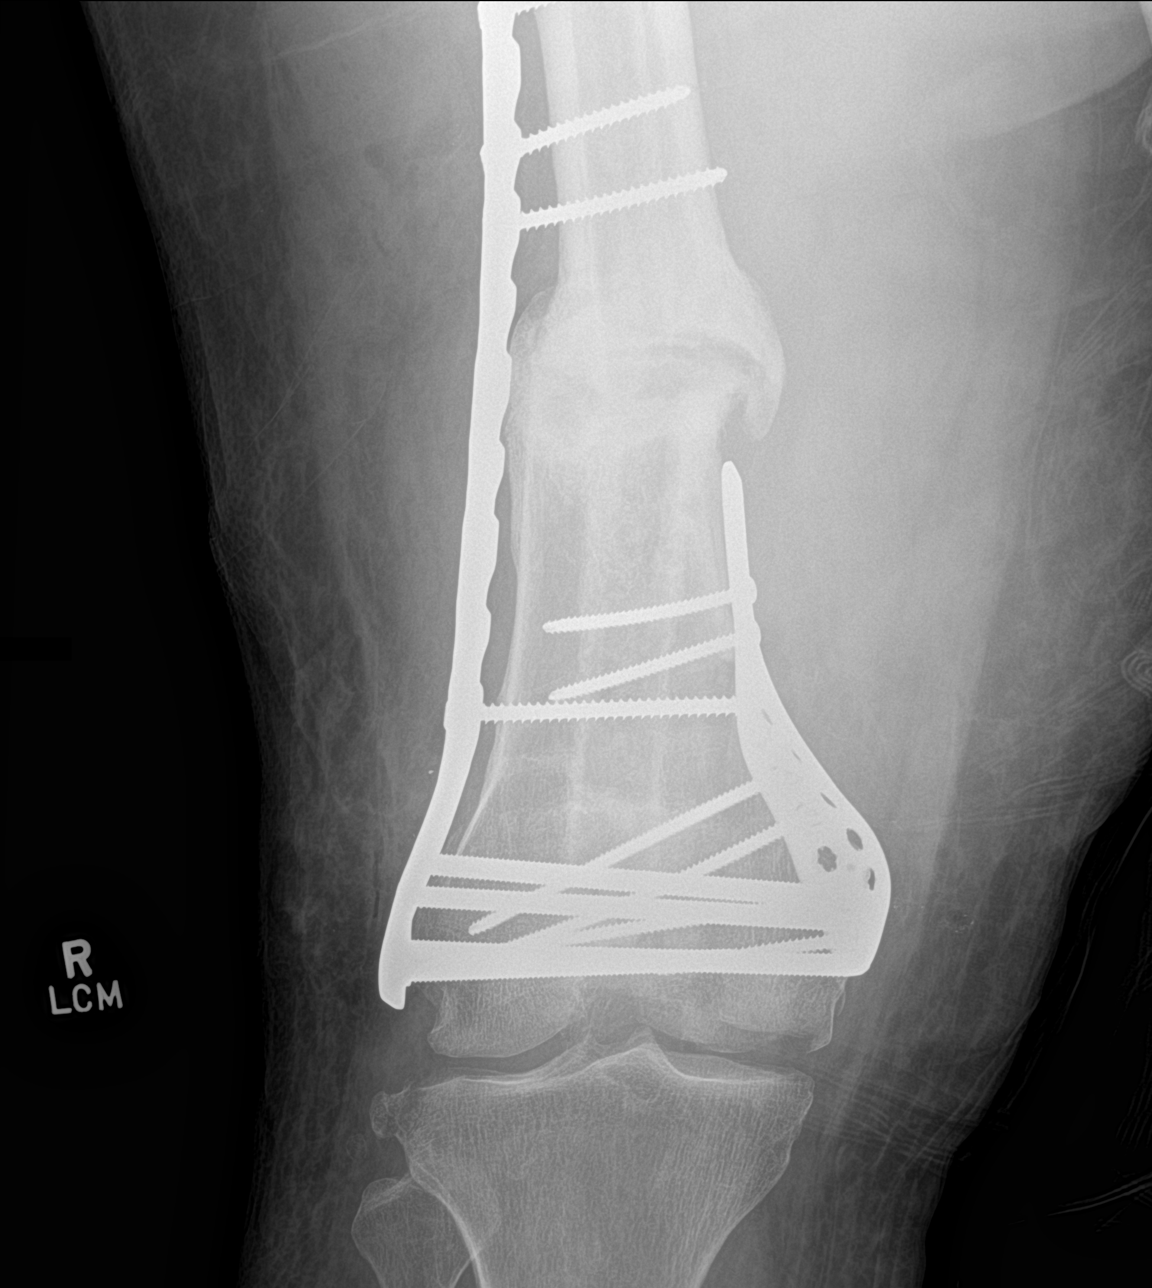
[im 4/4]
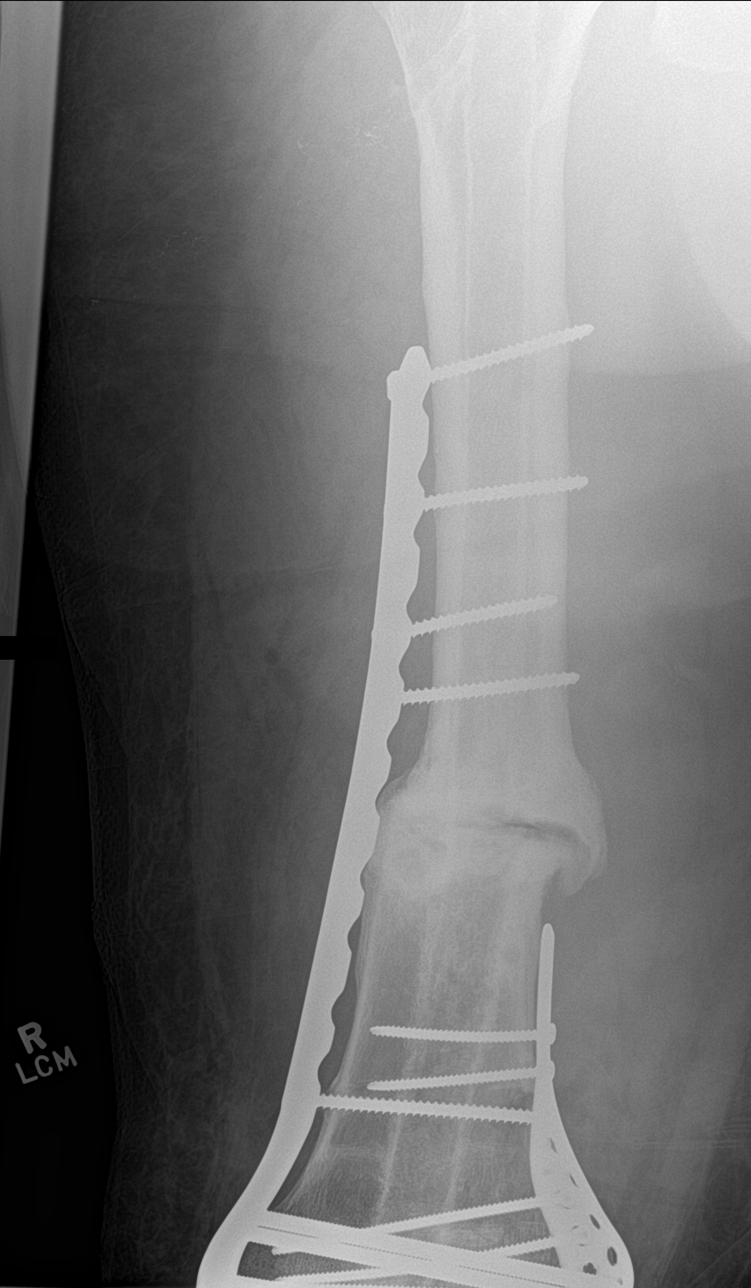

[4 of 4 positions shown; findings below may reference images not displayed]

FINDINGS: Dynamic plate fixation of the medial and lateral femoral condyles.
Nonunion of midshaft femur fracture again noted. The lateral dynamic
fixation plate spans the remote fracture.
IMPRESSION: ORIF distal femur fracture

## 2023-11-22 ENCOUNTER — Ambulatory Visit (HOSPITAL_COMMUNITY)
Admission: EM | Admit: 2023-11-22 | Discharge: 2023-11-22 | Disposition: A | Discharge status: Home / Self Care | Payer: Medicare Other | Attending: Family Medicine | Admitting: Family Medicine

## 2023-11-22 ENCOUNTER — Encounter (HOSPITAL_COMMUNITY): Payer: Self-pay

## 2023-11-22 DIAGNOSIS — K0889 Other specified disorders of teeth and supporting structures: Secondary | ICD-10-CM | POA: Diagnosis not present

## 2023-11-22 MED ORDER — AMOXICILLIN-POT CLAVULANATE 875-125 MG PO TABS: 1.0000 | ORAL_TABLET | Freq: Two times a day (BID) | ORAL | 0 refills | Status: DC

## 2023-11-22 NOTE — Discharge Instructions (Signed)
 You were seen today for dental pain.  I have sent out an antibiotic to your pharmacy.  I recommend tylenol /motrin for pain.  Please follow up with a dentist if this continues or returns.
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
## 2023-11-22 NOTE — ED Triage Notes (Signed)
 Patient having right lower dental pain for the last 5 days and getting worse. No known infections or broken teeth.   Patient has not taken anything for pain.

## 2024-01-07 ENCOUNTER — Emergency Department (HOSPITAL_COMMUNITY)

## 2024-01-07 ENCOUNTER — Other Ambulatory Visit: Payer: Self-pay

## 2024-01-07 ENCOUNTER — Encounter (HOSPITAL_COMMUNITY): Payer: Self-pay

## 2024-01-07 ENCOUNTER — Encounter (HOSPITAL_COMMUNITY): Payer: Self-pay | Admitting: Emergency Medicine

## 2024-01-07 ENCOUNTER — Ambulatory Visit (HOSPITAL_COMMUNITY)
Admission: EM | Admit: 2024-01-07 | Discharge: 2024-01-07 | Disposition: A | Discharge status: Home / Self Care | Attending: Internal Medicine | Admitting: Internal Medicine

## 2024-01-07 ENCOUNTER — Emergency Department (HOSPITAL_COMMUNITY)
Admission: EM | Admit: 2024-01-07 | Discharge: 2024-01-08 | Disposition: A | Discharge status: Home / Self Care | Attending: Emergency Medicine | Admitting: Emergency Medicine

## 2024-01-07 DIAGNOSIS — N289 Disorder of kidney and ureter, unspecified: Secondary | ICD-10-CM | POA: Insufficient documentation

## 2024-01-07 DIAGNOSIS — Z794 Long term (current) use of insulin: Secondary | ICD-10-CM | POA: Diagnosis not present

## 2024-01-07 DIAGNOSIS — I10 Essential (primary) hypertension: Secondary | ICD-10-CM | POA: Insufficient documentation

## 2024-01-07 DIAGNOSIS — Z7984 Long term (current) use of oral hypoglycemic drugs: Secondary | ICD-10-CM | POA: Insufficient documentation

## 2024-01-07 DIAGNOSIS — R079 Chest pain, unspecified: Secondary | ICD-10-CM | POA: Diagnosis present

## 2024-01-07 DIAGNOSIS — Z79899 Other long term (current) drug therapy: Secondary | ICD-10-CM | POA: Insufficient documentation

## 2024-01-07 DIAGNOSIS — Z7901 Long term (current) use of anticoagulants: Secondary | ICD-10-CM | POA: Insufficient documentation

## 2024-01-07 DIAGNOSIS — E119 Type 2 diabetes mellitus without complications: Secondary | ICD-10-CM | POA: Diagnosis not present

## 2024-01-07 DIAGNOSIS — Z8546 Personal history of malignant neoplasm of prostate: Secondary | ICD-10-CM | POA: Insufficient documentation

## 2024-01-07 DIAGNOSIS — J181 Lobar pneumonia, unspecified organism: Secondary | ICD-10-CM | POA: Diagnosis not present

## 2024-01-07 DIAGNOSIS — J189 Pneumonia, unspecified organism: Secondary | ICD-10-CM

## 2024-01-07 LAB — BASIC METABOLIC PANEL
Anion gap: 11 (ref 5–15)
BUN: 36 mg/dL — ABNORMAL HIGH (ref 8–23)
CO2: 24 mmol/L (ref 22–32)
Calcium: 9.4 mg/dL (ref 8.9–10.3)
Chloride: 102 mmol/L (ref 98–111)
Creatinine, Ser: 1.73 mg/dL — ABNORMAL HIGH (ref 0.61–1.24)
GFR, Estimated: 43 mL/min — ABNORMAL LOW (ref >60)
Glucose, Bld: 122 mg/dL — ABNORMAL HIGH (ref 70–99)
Potassium: 4 mmol/L (ref 3.5–5.1)
Sodium: 137 mmol/L (ref 135–145)

## 2024-01-07 LAB — CBC
HCT: 44.1 % (ref 39.0–52.0)
Hemoglobin: 15.1 g/dL (ref 13.0–17.0)
MCH: 30.9 pg (ref 26.0–34.0)
MCHC: 34.2 g/dL (ref 30.0–36.0)
MCV: 90.2 fL (ref 80.0–100.0)
Platelets: 303 10*3/uL (ref 150–400)
RBC: 4.89 MIL/uL (ref 4.22–5.81)
RDW: 12.4 % (ref 11.5–15.5)
WBC: 9 10*3/uL (ref 4.0–10.5)
nRBC: 0 % (ref 0.0–0.2)

## 2024-01-07 LAB — TROPONIN I (HIGH SENSITIVITY)
Troponin I (High Sensitivity): 6 ng/L (ref <18)
Troponin I (High Sensitivity): 6 ng/L (ref <18)

## 2024-01-07 NOTE — ED Triage Notes (Signed)
 Pt c/o sharp needle like stabbing pain episodes to lt side of chest since yesterday. States intermittent episodes lasting <5 secs. Pt c/o lt arm numbness yesterday. Denies SOB, nausea, or diaphoresis.

## 2024-01-07 NOTE — ED Notes (Signed)
 Patient is being discharged from the Urgent Care and sent to the Emergency Department via POV . Per Marcelino Duster, NP, patient is in need of higher level of care due to chest pain. Patient is aware and verbalizes understanding of plan of care.  Vitals:   01/07/24 1912  BP: (!) 165/80  Pulse: 85  Resp: 18  Temp: 98.5 F (36.9 C)  SpO2: 97%

## 2024-01-07 NOTE — ED Triage Notes (Signed)
 Pt reports being sent from UC due to chest pain. Pain on L side of chest. Pt also c/o left arm numbness.
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
## 2024-01-08 ENCOUNTER — Encounter (HOSPITAL_COMMUNITY): Payer: Self-pay

## 2024-01-08 ENCOUNTER — Emergency Department (HOSPITAL_COMMUNITY)

## 2024-01-08 DIAGNOSIS — J181 Lobar pneumonia, unspecified organism: Secondary | ICD-10-CM | POA: Diagnosis not present

## 2024-01-08 LAB — I-STAT CHEM 8, ED
BUN: 30 mg/dL — ABNORMAL HIGH (ref 8–23)
Calcium, Ion: 1.2 mmol/L (ref 1.15–1.40)
Chloride: 104 mmol/L (ref 98–111)
Creatinine, Ser: 1.5 mg/dL — ABNORMAL HIGH (ref 0.61–1.24)
Glucose, Bld: 135 mg/dL — ABNORMAL HIGH (ref 70–99)
HCT: 37 % — ABNORMAL LOW (ref 39.0–52.0)
Hemoglobin: 12.6 g/dL — ABNORMAL LOW (ref 13.0–17.0)
Potassium: 3.7 mmol/L (ref 3.5–5.1)
Sodium: 138 mmol/L (ref 135–145)
TCO2: 23 mmol/L (ref 22–32)

## 2024-01-08 MED ORDER — AMOXICILLIN-POT CLAVULANATE 875-125 MG PO TABS: 1.0000 | ORAL_TABLET | Freq: Two times a day (BID) | ORAL | 0 refills | Status: DC

## 2024-01-08 MED ORDER — SODIUM CHLORIDE 0.9 % IV BOLUS
500.0000 mL | Freq: Once | INTRAVENOUS | Status: AC
  Administered 2024-01-08: 500 mL via INTRAVENOUS

## 2024-01-08 MED ORDER — IOHEXOL 350 MG/ML SOLN
75.0000 mL | Freq: Once | INTRAVENOUS | Status: AC | PRN
  Administered 2024-01-08: 75 mL via INTRAVENOUS

## 2024-01-08 MED ORDER — SODIUM CHLORIDE (PF) 0.9 % IJ SOLN
INTRAMUSCULAR | Status: AC
  Filled 2024-01-08: qty 50

## 2024-01-08 MED ORDER — AMOXICILLIN-POT CLAVULANATE 875-125 MG PO TABS
1.0000 | ORAL_TABLET | Freq: Once | ORAL | Status: AC
  Administered 2024-01-08: 1 via ORAL
  Filled 2024-01-08: qty 1

## 2024-01-08 MED ORDER — ALUM & MAG HYDROXIDE-SIMETH 200-200-20 MG/5ML PO SUSP
30.0000 mL | Freq: Once | ORAL | Status: AC
  Administered 2024-01-08: 30 mL via ORAL
  Filled 2024-01-08: qty 30
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
## 2024-05-13 ENCOUNTER — Other Ambulatory Visit: Payer: Self-pay | Admitting: Orthopedic Surgery

## 2024-05-13 NOTE — Patient Instructions (Addendum)
 SURGICAL WAITING ROOM VISITATION Patients having surgery or a procedure may have no more than 2 support people in the waiting area - these visitors may rotate in the visitor waiting room.   Due to an increase in RSV and influenza rates and associated hospitalizations, children ages 19 and under may not visit patients in [facility] hospitals. If the patient needs to stay at the hospital during part of their recovery, the visitor guidelines for inpatient rooms apply.  PRE-OP VISITATION  Pre-op nurse will coordinate an appropriate time for 1 support person to accompany the patient in pre-op.  This support person may not rotate.  This visitor will be contacted when the time is appropriate for the visitor to come back in the pre-op area.  Please refer to the [facility] website for the visitor guidelines for Inpatients (after your surgery is over and you are in a regular room).  You are not required to quarantine at this time prior to your surgery. However, you must do this: Hand Hygiene often Do NOT share personal items Notify your provider if you are in close contact with someone who has COVID or you develop fever 100.4 or greater, new onset of sneezing, cough, sore throat, shortness of breath or body aches.  If you test positive for Covid or have been in contact with anyone that has tested positive in the last 10 days please notify you surgeon.    Your procedure is scheduled on:  05/20/24  Report to [facility] Main Entrance: [place] entrance where the [employer] is available.   Report to admitting at: 7:30 AM  Call this number if you have any questions or problems the morning of surgery [phone]  FOLLOW ANY ADDITIONAL PRE OP INSTRUCTIONS YOU RECEIVED FROM YOUR SURGEON'S OFFICE!!!  Do not eat food after Midnight the night prior to your surgery/procedure.  After Midnight you may have the following liquids until: 7:00 AM DAY OF SURGERY  Clear Liquid Diet Water  Black  Coffee (sugar ok, NO MILK/CREAM OR CREAMERS)  Tea (sugar ok, NO MILK/CREAM OR CREAMERS) regular and decaf                             Plain Jell-O  with no fruit (NO RED)                                           Fruit ices (not with fruit pulp, NO RED)                                     Popsicles (NO RED)                                                                  Juice: NO CITRUS JUICES: only apple, WHITE grape, WHITE cranberry Sports drinks like Gatorade or Powerade (NO RED)   The day of surgery:  Drink ONE (1) Pre-Surgery Clear G2 at : 7:00 AM the morning of surgery. Drink in one sitting. Do not sip.  This drink was given  to you during your hospital pre-op appointment visit. Nothing else to drink after completing the Pre-Surgery Clear Ensure or G2 : No candy, chewing gum or throat lozenges.    Oral Hygiene is also important to reduce your risk of infection.        Remember - BRUSH YOUR TEETH THE MORNING OF SURGERY WITH YOUR REGULAR TOOTHPASTE  Do NOT smoke after Midnight the night before surgery.  STOP TAKING all Vitamins, Herbs and supplements 1 week before your surgery.   Take ONLY these medicines the morning of surgery with A SIP OF WATER : gabapentin ,amlodipine ,allopurinol   How to Manage Your Diabetes Before and After Surgery  Why is it important to control my blood sugar before and after surgery? Improving blood sugar levels before and after surgery helps healing and can limit problems. A way of improving blood sugar control is eating a healthy diet by:  Eating less sugar and carbohydrates  Increasing activity/exercise  Talking with your doctor about reaching your blood sugar goals High blood sugars (greater than 180 mg/dL) can raise your risk of infections and slow your recovery, so you will need to focus on controlling your diabetes during the weeks before surgery. Make sure that the doctor who takes care of your diabetes knows about your planned surgery including the  date and location.  How do I manage my blood sugar before surgery? Check your blood sugar at least 4 times a day, starting 2 days before surgery, to make sure that the level is not too high or low. Check your blood sugar the morning of your surgery when you wake up and every 2 hours until you get to the Short Stay unit. If your blood sugar is less than 70 mg/dL, you will need to treat for low blood sugar: Do not take insulin . Treat a low blood sugar (less than 70 mg/dL) with  cup of clear juice (cranberry or apple), 4 glucose tablets, OR glucose gel. Recheck blood sugar in 15 minutes after treatment (to make sure it is greater than 70 mg/dL). If your blood sugar is not greater than 70 mg/dL on recheck, call [phone] for further instructions. Report your blood sugar to the short stay nurse when you get to Short Stay.  If you are admitted to the hospital after surgery: Your blood sugar will be checked by the staff and you will probably be given insulin  after surgery (instead of oral diabetes medicines) to make sure you have good blood sugar levels. The goal for blood sugar control after surgery is 80-180 mg/dL.   WHAT DO I DO ABOUT MY DIABETES MEDICATION?  Do not take oral diabetes medicines (pills) the morning of surgery.  THE NIGHT BEFORE SURGERY, take ONLY half of lantus  insulin  dose (6 units).      THE MORNING OF SURGERY, If your CBG is greater than 220 mg/dL, you may take  of your sliding scale  (correction) dose of insulin .  DO NOT TAKE THE FOLLOWING 7 DAYS PRIOR TO SURGERY: Ozempic, Wegovy, Rybelsus (Semaglutide), Byetta (exenatide), Bydureon (exenatide ER), Victoza, Saxenda (liraglutide), or Trulicity (dulaglutide) Mounjaro (Tirzepatide) Adlyxin (Lixisenatide), Polyethylene Glycol Loxenatide.   If You have been diagnosed with Sleep Apnea - Bring CPAP mask and tubing day of surgery. We will provide you with a CPAP machine on the day of your surgery.                   You may  not have any metal on your body including hair pins, jewelry,  and body piercing  Do not wear lotions, powders, perfumes / cologne, or deodorant  Men may shave face and neck.  Contacts, Hearing Aids, dentures or bridgework may not be worn into surgery. DENTURES WILL BE REMOVED PRIOR TO SURGERY PLEASE DO NOT APPLY Poly grip OR ADHESIVES!!!  You may bring a small overnight bag with you on the day of surgery, only pack items that are not valuable. [facility] IS NOT RESPONSIBLE   FOR VALUABLES THAT ARE LOST OR STOLEN.   Patients discharged on the day of surgery will not be allowed to drive home.  Someone NEEDS to stay with you for the first 24 hours after anesthesia.  Do not bring your home medications to the hospital. The Pharmacy will dispense medications listed on your medication list to you during your admission in the Hospital.  Special Instructions: Bring a copy of your healthcare power of attorney and living will documents the day of surgery, if you wish to have them scanned into your [facility] Records- EPIC  Please read over the following fact sheets you were given: IF YOU HAVE QUESTIONS ABOUT YOUR PRE-OP INSTRUCTIONS, PLEASE CALL [phone]   [facility] - Preparing for Surgery Before surgery, you can play an important role.  Because skin is not sterile, your skin needs to be as free of germs as possible.  You can reduce the number of germs on your skin by washing with CHG (chlorahexidine gluconate) soap before surgery.  CHG is an antiseptic cleaner which kills germs and bonds with the skin to continue killing germs even after washing. Please DO NOT use if you have an allergy to CHG or antibacterial soaps.  If your skin becomes reddened/irritated stop using the CHG and inform your nurse when you arrive at Short Stay. Do not shave (including legs and underarms) for at least 48 hours prior to the first CHG shower.  You may shave your face/neck.  Please follow these  instructions carefully:  1.  Shower with CHG Soap the night before surgery and the  morning of surgery.  2.  If you choose to wash your hair, wash your hair first as usual with your normal  shampoo.  3.  After you shampoo, rinse your hair and body thoroughly to remove the shampoo.                             4.  Use CHG as you would any other liquid soap.  You can apply chg directly to the skin and wash.  Gently with a scrungie or clean washcloth.  5.  Apply the CHG Soap to your body ONLY FROM THE NECK DOWN.   Do not use on face/ open                           Wound or open sores. Avoid contact with eyes, ears mouth and genitals (private parts).                       Wash face,  Genitals (private parts) with your normal soap.             6.  Wash thoroughly, paying special attention to the area where your  surgery  will be performed.  7.  Thoroughly rinse your body with warm water  from the neck down.  8.  DO NOT shower/wash with your normal soap after using  and rinsing off the CHG Soap.            9.  Pat yourself dry with a clean towel.            10.  Wear clean pajamas.            11.  Place clean sheets on your bed the night of your first shower and do not  sleep with pets.  ON THE DAY OF SURGERY : Do not apply any lotions/deodorants the morning of surgery.  Please wear clean clothes to the hospital/surgery center.     FAILURE TO FOLLOW THESE INSTRUCTIONS MAY RESULT IN THE CANCELLATION OF YOUR SURGERY  PATIENT SIGNATURE_________________________________  NURSE SIGNATURE__________________________________  ________________________________________________________________________    Pre-operative 5 CHG Bath Instructions   You can play a key role in reducing the risk of infection after surgery. Your skin needs to be as free of germs as possible. You can reduce the number of germs on your skin by washing with CHG (chlorhexidine  gluconate) soap before surgery. CHG is an antiseptic soap  that kills germs and continues to kill germs even after washing.   DO NOT use if you have an allergy to chlorhexidine /CHG or antibacterial soaps. If your skin becomes reddened or irritated, stop using the CHG and notify one of our RNs at [phone].   Please shower with the CHG soap starting 4 days before surgery using the following schedule:     Please keep in mind the following:  DO NOT shave, including legs and underarms, starting the day of your first shower.   You may shave your face at any point before/day of surgery.  Place clean sheets on your bed the day you start using CHG soap. Use a clean washcloth (not used since being washed) for each shower. DO NOT sleep with pets once you start using the CHG.   CHG Shower Instructions:  If you choose to wash your hair and private area, wash first with your normal shampoo/soap.  After you use shampoo/soap, rinse your hair and body thoroughly to remove shampoo/soap residue.  Turn the water  OFF and apply about 3 tablespoons (45 ml) of CHG soap to a CLEAN washcloth.  Apply CHG soap ONLY FROM YOUR NECK DOWN TO YOUR TOES (washing for 3-5 minutes)  DO NOT use CHG soap on face, private areas, open wounds, or sores.  Pay special attention to the area where your surgery is being performed.  If you are having back surgery, having someone wash your back for you may be helpful. Wait 2 minutes after CHG soap is applied, then you may rinse off the CHG soap.  Pat dry with a clean towel  Put on clean clothes/pajamas   If you choose to wear lotion, please use ONLY the CHG-compatible lotions on the back of this paper.     Additional instructions for the day of surgery: DO NOT APPLY any lotions, deodorants, cologne, or perfumes.   Put on clean/comfortable clothes.  Brush your teeth.  Ask your nurse before applying any prescription medications to the skin.   CHG Compatible Lotions   Aveeno Moisturizing lotion  Cetaphil Moisturizing Cream  Cetaphil  Moisturizing Lotion  Clairol Herbal Essence Moisturizing Lotion, Dry Skin  Clairol Herbal Essence Moisturizing Lotion, Extra Dry Skin  Clairol Herbal Essence Moisturizing Lotion, Normal Skin  Curel Age Defying Therapeutic Moisturizing Lotion with Alpha Hydroxy  Curel Extreme Care Body Lotion  Curel Soothing Hands Moisturizing Hand Lotion  Curel Therapeutic Moisturizing Cream, Fragrance-Free  Curel Therapeutic Moisturizing Lotion, Fragrance-Free  Curel Therapeutic Moisturizing Lotion, Original Formula  Eucerin Daily Replenishing Lotion  Eucerin Dry Skin Therapy Plus Alpha Hydroxy Crme  Eucerin Dry Skin Therapy Plus Alpha Hydroxy Lotion  Eucerin Original Crme  Eucerin Original Lotion  Eucerin Plus Crme Eucerin Plus Lotion  Eucerin TriLipid Replenishing Lotion  Keri Anti-Bacterial Hand Lotion  Keri Deep Conditioning Original Lotion Dry Skin Formula Softly Scented  Keri Deep Conditioning Original Lotion, Fragrance Free Sensitive Skin Formula  Keri Lotion Fast Absorbing Fragrance Free Sensitive Skin Formula  Keri Lotion Fast Absorbing Softly Scented Dry Skin Formula  Keri Original Lotion  Keri Skin Renewal Lotion Keri Silky Smooth Lotion  Keri Silky Smooth Sensitive Skin Lotion  Nivea Body Creamy Conditioning Oil  Nivea Body Extra Enriched Lotion  Nivea Body Original Lotion  Nivea Body Sheer Moisturizing Lotion Nivea Crme  Nivea Skin Firming Lotion  NutraDerm 30 Skin Lotion  NutraDerm Skin Lotion  NutraDerm Therapeutic Skin Cream  NutraDerm Therapeutic Skin Lotion  ProShield Protective Hand Cream  Provon moisturizing lotion   Incentive Spirometer  An incentive spirometer is a tool that can help keep your lungs clear and active. This tool measures how well you are filling your lungs with each breath. Taking long deep breaths may help reverse or decrease the chance of developing breathing (pulmonary) problems (especially infection) following: A long period of time when you are  unable to move or be active. BEFORE THE PROCEDURE  If the spirometer includes an indicator to show your best effort, your nurse or respiratory therapist will set it to a desired goal. If possible, sit up straight or lean slightly forward. Try not to slouch. Hold the incentive spirometer in an upright position. INSTRUCTIONS FOR USE  Sit on the edge of your bed if possible, or sit up as far as you can in bed or on a chair. Hold the incentive spirometer in an upright position. Breathe out normally. Place the mouthpiece in your mouth and seal your lips tightly around it. Breathe in slowly and as deeply as possible, raising the piston or the ball toward the top of the column. Hold your breath for 3-5 seconds or for as long as possible. Allow the piston or ball to fall to the bottom of the column. Remove the mouthpiece from your mouth and breathe out normally. Rest for a few seconds and repeat Steps 1 through 7 at least 10 times every 1-2 hours when you are awake. Take your time and take a few normal breaths between deep breaths. The spirometer may include an indicator to show your best effort. Use the indicator as a goal to work toward during each repetition. After each set of 10 deep breaths, practice coughing to be sure your lungs are clear. If you have an incision (the cut made at the time of surgery), support your incision when coughing by placing a pillow or rolled up towels firmly against it. Once you are able to get out of bed, walk around indoors and cough well. You may stop using the incentive spirometer when instructed by your caregiver.  RISKS AND COMPLICATIONS Take your time so you do not get dizzy or light-headed. If you are in pain, you may need to take or ask for pain medication before doing incentive spirometry. It is harder to take a deep breath if you are having pain. AFTER USE Rest and breathe slowly and easily. It can be helpful to keep track of a log of your progress. Your  caregiver can provide you with a simple table to help with this. If you are using the spirometer at home, follow these instructions: SEEK MEDICAL CARE IF:  You are having difficultly using the spirometer. You have trouble using the spirometer as often as instructed. Your pain medication is not giving enough relief while using the spirometer. You develop fever of 100.5 F (38.1 C) or higher. SEEK IMMEDIATE MEDICAL CARE IF:  You cough up bloody sputum that had not been present before. You develop fever of 102 F (38.9 C) or greater. You develop worsening pain at or near the incision site. MAKE SURE YOU:  Understand these instructions. Will watch your condition. Will get help right away if you are not doing well or get worse. Document Released: 02/12/2007 Document Revised: 12/25/2011 Document Reviewed: 04/15/2007 [facility] Patient Information 2014 [place], [place].   ________________________________________________________________________

## 2024-05-14 ENCOUNTER — Encounter (HOSPITAL_COMMUNITY): Payer: Self-pay

## 2024-05-14 ENCOUNTER — Other Ambulatory Visit: Payer: Self-pay

## 2024-05-14 ENCOUNTER — Encounter (HOSPITAL_COMMUNITY)
Admission: RE | Admit: 2024-05-14 | Discharge: 2024-05-14 | Disposition: A | Discharge status: Home / Self Care | Source: Ambulatory Visit | Attending: Orthopedic Surgery | Admitting: Orthopedic Surgery

## 2024-05-14 VITALS — BP 142/82 | HR 84 | Temp 98.1°F | Ht 65.0 in | Wt 242.0 lb

## 2024-05-14 DIAGNOSIS — Z01812 Encounter for preprocedural laboratory examination: Secondary | ICD-10-CM | POA: Insufficient documentation

## 2024-05-14 DIAGNOSIS — Z01818 Encounter for other preprocedural examination: Secondary | ICD-10-CM

## 2024-05-14 DIAGNOSIS — E119 Type 2 diabetes mellitus without complications: Secondary | ICD-10-CM | POA: Insufficient documentation

## 2024-05-14 DIAGNOSIS — Z794 Long term (current) use of insulin: Secondary | ICD-10-CM | POA: Insufficient documentation

## 2024-05-14 HISTORY — DX: Unspecified osteoarthritis, unspecified site: M19.90

## 2024-05-14 LAB — COMPREHENSIVE METABOLIC PANEL WITH GFR
ALT: 22 U/L (ref 0–44)
AST: 28 U/L (ref 15–41)
Albumin: 4.2 g/dL (ref 3.5–5.0)
Alkaline Phosphatase: 104 U/L (ref 38–126)
Anion gap: 14 (ref 5–15)
BUN: 24 mg/dL — ABNORMAL HIGH (ref 8–23)
CO2: 24 mmol/L (ref 22–32)
Calcium: 9.9 mg/dL (ref 8.9–10.3)
Chloride: 101 mmol/L (ref 98–111)
Creatinine, Ser: 1.4 mg/dL — ABNORMAL HIGH (ref 0.61–1.24)
GFR, Estimated: 56 mL/min — ABNORMAL LOW (ref >60)
Glucose, Bld: 258 mg/dL — ABNORMAL HIGH (ref 70–99)
Potassium: 4.2 mmol/L (ref 3.5–5.1)
Sodium: 139 mmol/L (ref 135–145)
Total Bilirubin: 0.7 mg/dL (ref 0.0–1.2)
Total Protein: 7.9 g/dL (ref 6.5–8.1)

## 2024-05-14 LAB — CBC WITH DIFFERENTIAL/PLATELET
Abs Immature Granulocytes: 0.03 K/uL (ref 0.00–0.07)
Basophils Absolute: 0 K/uL (ref 0.0–0.1)
Basophils Relative: 1 %
Eosinophils Absolute: 0.2 K/uL (ref 0.0–0.5)
Eosinophils Relative: 4 %
HCT: 42.8 % (ref 39.0–52.0)
Hemoglobin: 15 g/dL (ref 13.0–17.0)
Immature Granulocytes: 0 %
Lymphocytes Relative: 31 %
Lymphs Abs: 2.1 K/uL (ref 0.7–4.0)
MCH: 31.1 pg (ref 26.0–34.0)
MCHC: 35 g/dL (ref 30.0–36.0)
MCV: 88.8 fL (ref 80.0–100.0)
Monocytes Absolute: 0.5 K/uL (ref 0.1–1.0)
Monocytes Relative: 7 %
Neutro Abs: 3.9 K/uL (ref 1.7–7.7)
Neutrophils Relative %: 57 %
Platelets: 272 K/uL (ref 150–400)
RBC: 4.82 MIL/uL (ref 4.22–5.81)
RDW: 12.3 % (ref 11.5–15.5)
WBC: 6.7 K/uL (ref 4.0–10.5)
nRBC: 0 % (ref 0.0–0.2)

## 2024-05-14 LAB — URINALYSIS, COMPLETE (UACMP) WITH MICROSCOPIC
Bacteria, UA: NONE SEEN
Bilirubin Urine: NEGATIVE
Glucose, UA: 150 mg/dL — AB
Hgb urine dipstick: NEGATIVE
Ketones, ur: NEGATIVE mg/dL
Leukocytes,Ua: NEGATIVE
Nitrite: NEGATIVE
Protein, ur: NEGATIVE mg/dL
Specific Gravity, Urine: 1.024 (ref 1.005–1.030)
pH: 5 (ref 5.0–8.0)

## 2024-05-14 LAB — PROTIME-INR
INR: 1 (ref 0.8–1.2)
Prothrombin Time: 13.4 s (ref 11.4–15.2)

## 2024-05-14 LAB — SURGICAL PCR SCREEN
MRSA, PCR: NEGATIVE
Staphylococcus aureus: NEGATIVE

## 2024-05-14 LAB — GLUCOSE, CAPILLARY: Glucose-Capillary: 264 mg/dL — ABNORMAL HIGH (ref 70–99)
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
## 2024-05-14 NOTE — Progress Notes (Signed)
   05/14/24 1028  OBSTRUCTIVE SLEEP APNEA  Have you ever been diagnosed with sleep apnea through a sleep study? No  Do you snore loudly (loud enough to be heard through closed doors)?  1  Do you often feel tired, fatigued, or sleepy during the daytime (such as falling asleep during driving or talking to someone)? 1  Has anyone observed you stop breathing during your sleep? 0  Do you have, or are you being treated for high blood pressure? 1  BMI more than 35 kg/m2? 1  Age > 50 (1-yes) 1  Neck circumference greater than:Male 16 inches or larger, Male 17inches or larger? 0  Male Gender (Yes=1) 1  Obstructive Sleep Apnea Score 6  Score 5 or greater  Results sent to PCP

## 2024-05-14 NOTE — Progress Notes (Signed)
 Lab. Results: Glucose capillary: 264.  Glucose blood: 258. Glucose UA: 150.

## 2024-05-19 ENCOUNTER — Encounter (HOSPITAL_COMMUNITY): Payer: Self-pay

## 2024-05-19 MED ORDER — TRANEXAMIC ACID 1000 MG/10ML IV SOLN
2000.0000 mg | INTRAVENOUS | Status: DC
  Filled 2024-05-19: qty 20
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
## 2024-05-19 NOTE — Anesthesia Preprocedure Evaluation (Signed)
 Anesthesia Evaluation  Patient identified by MRN, date of birth, ID band Patient awake    Reviewed: Allergy & Precautions, NPO status , Patient's Chart, lab work & pertinent test results, reviewed documented beta blocker date and time   Airway Mallampati: II  TM Distance: >3 FB     Dental  (+) Teeth Intact, Caps, Dental Advisory Given   Pulmonary pneumonia, resolved, former smoker   Pulmonary exam normal breath sounds clear to auscultation       Cardiovascular hypertension, Pt. on medications Normal cardiovascular exam Rhythm:Regular Rate:Normal     Neuro/Psych Peripheral neuropathy  Neuromuscular disease  negative psych ROS   GI/Hepatic negative GI ROS, Neg liver ROS,,,  Endo/Other  diabetes, Well Controlled, Type 2, Oral Hypoglycemic Agents  Class 3 obesityGout HLD  Renal/GU negative Renal ROS  negative genitourinary   Musculoskeletal  (+) Arthritis , Osteoarthritis,    Abdominal  (+) + obese  Peds  Hematology negative hematology ROS (+)   Anesthesia Other Findings   Reproductive/Obstetrics                              Anesthesia Physical Anesthesia Plan  ASA: 3  Anesthesia Plan: Spinal   Post-op Pain Management: Regional block* and Minimal or no pain anticipated   Induction: Intravenous  PONV Risk Score and Plan: 2 and Treatment may vary due to age or medical condition, Propofol  infusion and Ondansetron   Airway Management Planned: Natural Airway and Simple Face Mask  Additional Equipment: None  Intra-op Plan:   Post-operative Plan: Extubation in OR  Informed Consent: I have reviewed the patients History and Physical, chart, labs and discussed the procedure including the risks, benefits and alternatives for the proposed anesthesia with the patient or authorized representative who has indicated his/her understanding and acceptance.     Dental advisory given  Plan  Discussed with: CRNA and Anesthesiologist  Anesthesia Plan Comments: (See PAT note from 7/30)         Anesthesia Quick Evaluation

## 2024-05-20 ENCOUNTER — Other Ambulatory Visit: Payer: Self-pay

## 2024-05-20 ENCOUNTER — Observation Stay (HOSPITAL_COMMUNITY)
Admission: RE | Admit: 2024-05-20 | Discharge: 2024-05-21 | Disposition: A | Discharge status: Home / Self Care | Attending: Orthopedic Surgery | Admitting: Orthopedic Surgery

## 2024-05-20 ENCOUNTER — Ambulatory Visit (HOSPITAL_BASED_OUTPATIENT_CLINIC_OR_DEPARTMENT_OTHER): Admitting: Certified Registered Nurse Anesthetist

## 2024-05-20 ENCOUNTER — Encounter (HOSPITAL_COMMUNITY)
Admission: RE | Disposition: A | Discharge status: Home / Self Care | Payer: Self-pay | Source: Home / Self Care | Attending: Orthopedic Surgery

## 2024-05-20 ENCOUNTER — Encounter (HOSPITAL_COMMUNITY): Payer: Self-pay | Admitting: Orthopedic Surgery

## 2024-05-20 ENCOUNTER — Ambulatory Visit (HOSPITAL_COMMUNITY): Payer: Self-pay | Admitting: Medical

## 2024-05-20 ENCOUNTER — Observation Stay (HOSPITAL_COMMUNITY)

## 2024-05-20 DIAGNOSIS — E7800 Pure hypercholesterolemia, unspecified: Secondary | ICD-10-CM

## 2024-05-20 DIAGNOSIS — F1090 Alcohol use, unspecified, uncomplicated: Secondary | ICD-10-CM | POA: Diagnosis not present

## 2024-05-20 DIAGNOSIS — E119 Type 2 diabetes mellitus without complications: Secondary | ICD-10-CM | POA: Diagnosis not present

## 2024-05-20 DIAGNOSIS — Z794 Long term (current) use of insulin: Secondary | ICD-10-CM | POA: Insufficient documentation

## 2024-05-20 DIAGNOSIS — M25562 Pain in left knee: Secondary | ICD-10-CM | POA: Diagnosis present

## 2024-05-20 DIAGNOSIS — M1712 Unilateral primary osteoarthritis, left knee: Secondary | ICD-10-CM

## 2024-05-20 DIAGNOSIS — I10 Essential (primary) hypertension: Secondary | ICD-10-CM | POA: Diagnosis not present

## 2024-05-20 DIAGNOSIS — Z87892 Personal history of anaphylaxis: Secondary | ICD-10-CM | POA: Insufficient documentation

## 2024-05-20 DIAGNOSIS — Z96652 Presence of left artificial knee joint: Secondary | ICD-10-CM | POA: Insufficient documentation

## 2024-05-20 HISTORY — PX: TOTAL KNEE ARTHROPLASTY: SHX125

## 2024-05-20 LAB — GLUCOSE, CAPILLARY
Glucose-Capillary: 149 mg/dL — ABNORMAL HIGH (ref 70–99)
Glucose-Capillary: 139 mg/dL — ABNORMAL HIGH (ref 70–99)

## 2024-05-20 SURGERY — ARTHROPLASTY, KNEE, TOTAL
Anesthesia: Spinal | Site: Knee | Laterality: Left

## 2024-05-20 MED ORDER — FLEET ENEMA RE ENEM: 1.0000 | ENEMA | Freq: Once | RECTAL | Status: DC | PRN

## 2024-05-20 MED ORDER — HYDROMORPHONE HCL 1 MG/ML IJ SOLN
0.5000 mg | INTRAMUSCULAR | Status: DC | PRN
  Administered 2024-05-20: 1 mg via INTRAVENOUS
  Filled 2024-05-20: qty 1

## 2024-05-20 MED ORDER — SODIUM CHLORIDE (PF) 0.9 % IJ SOLN
INTRAMUSCULAR | Status: DC | PRN
  Administered 2024-05-20: 120 mL

## 2024-05-20 MED ORDER — MIDAZOLAM HCL 2 MG/2ML IJ SOLN
INTRAMUSCULAR | Status: DC | PRN
  Administered 2024-05-20: 2 mg via INTRAVENOUS

## 2024-05-20 MED ORDER — ONDANSETRON HCL 4 MG/2ML IJ SOLN: 4.0000 mg | Freq: Once | INTRAMUSCULAR | Status: DC | PRN

## 2024-05-20 MED ORDER — SODIUM CHLORIDE (PF) 0.9 % IJ SOLN
INTRAMUSCULAR | Status: AC
  Filled 2024-05-20: qty 50

## 2024-05-20 MED ORDER — PHENOL 1.4 % MT LIQD: 1.0000 | OROMUCOSAL | Status: DC | PRN

## 2024-05-20 MED ORDER — PANTOPRAZOLE SODIUM 40 MG PO TBEC
40.0000 mg | DELAYED_RELEASE_TABLET | Freq: Every day | ORAL | Status: DC
  Administered 2024-05-20 – 2024-05-21 (×2): 40 mg via ORAL
  Filled 2024-05-20 (×2): qty 1

## 2024-05-20 MED ORDER — BUPIVACAINE-EPINEPHRINE (PF) 0.25% -1:200000 IJ SOLN
INTRAMUSCULAR | Status: AC
  Filled 2024-05-20: qty 30

## 2024-05-20 MED ORDER — CHLORHEXIDINE GLUCONATE 0.12 % MT SOLN
15.0000 mL | Freq: Once | OROMUCOSAL | Status: AC
  Administered 2024-05-20: 15 mL via OROMUCOSAL

## 2024-05-20 MED ORDER — MENTHOL 3 MG MT LOZG: 1.0000 | LOZENGE | OROMUCOSAL | Status: DC | PRN

## 2024-05-20 MED ORDER — OXYCODONE HCL 5 MG PO TABS
ORAL_TABLET | ORAL | Status: AC
  Filled 2024-05-20: qty 1

## 2024-05-20 MED ORDER — OXYCODONE HCL 5 MG/5ML PO SOLN: 5.0000 mg | Freq: Once | ORAL | Status: AC | PRN

## 2024-05-20 MED ORDER — BISACODYL 5 MG PO TBEC: 5.0000 mg | DELAYED_RELEASE_TABLET | Freq: Every day | ORAL | Status: DC | PRN

## 2024-05-20 MED ORDER — ACETAMINOPHEN 325 MG PO TABS
650.0000 mg | ORAL_TABLET | Freq: Four times a day (QID) | ORAL | Status: DC
  Administered 2024-05-21 (×2): 650 mg via ORAL
  Filled 2024-05-20 (×2): qty 2

## 2024-05-20 MED ORDER — METFORMIN HCL 500 MG PO TABS
500.0000 mg | ORAL_TABLET | Freq: Two times a day (BID) | ORAL | Status: DC
  Administered 2024-05-20 – 2024-05-21 (×2): 500 mg via ORAL
  Filled 2024-05-20 (×2): qty 1

## 2024-05-20 MED ORDER — LACTATED RINGERS IV BOLUS
500.0000 mL | Freq: Once | INTRAVENOUS | Status: AC
  Administered 2024-05-20: 500 mL via INTRAVENOUS

## 2024-05-20 MED ORDER — DEXAMETHASONE SODIUM PHOSPHATE 10 MG/ML IJ SOLN
10.0000 mg | Freq: Once | INTRAMUSCULAR | Status: AC
  Administered 2024-05-21: 10 mg via INTRAVENOUS
  Filled 2024-05-20: qty 1

## 2024-05-20 MED ORDER — SODIUM CHLORIDE 0.9 % IR SOLN
Status: DC | PRN
  Administered 2024-05-20: 1000 mL

## 2024-05-20 MED ORDER — ONDANSETRON HCL 4 MG PO TABS: 4.0000 mg | ORAL_TABLET | Freq: Three times a day (TID) | ORAL | 0 refills | Status: AC | PRN

## 2024-05-20 MED ORDER — TRANEXAMIC ACID-NACL 1000-0.7 MG/100ML-% IV SOLN
1000.0000 mg | INTRAVENOUS | Status: AC
  Administered 2024-05-20: 1000 mg via INTRAVENOUS
  Filled 2024-05-20: qty 100

## 2024-05-20 MED ORDER — FENTANYL CITRATE PF 50 MCG/ML IJ SOSY
PREFILLED_SYRINGE | INTRAMUSCULAR | Status: AC
Start: 2024-05-20 — End: 2024-05-20
  Filled 2024-05-20: qty 2

## 2024-05-20 MED ORDER — LISINOPRIL-HYDROCHLOROTHIAZIDE 20-25 MG PO TABS: 1.0000 | ORAL_TABLET | Freq: Every morning | ORAL | Status: DC

## 2024-05-20 MED ORDER — ASPIRIN 81 MG PO CHEW
81.0000 mg | CHEWABLE_TABLET | Freq: Two times a day (BID) | ORAL | Status: DC
  Administered 2024-05-21: 81 mg via ORAL
  Filled 2024-05-20: qty 1

## 2024-05-20 MED ORDER — WATER FOR IRRIGATION, STERILE IR SOLN
Status: DC | PRN
  Administered 2024-05-20: 2000 mL

## 2024-05-20 MED ORDER — SODIUM CHLORIDE (PF) 0.9 % IJ SOLN
INTRAMUSCULAR | Status: AC
  Filled 2024-05-20: qty 20

## 2024-05-20 MED ORDER — KETOROLAC TROMETHAMINE 15 MG/ML IJ SOLN
15.0000 mg | Freq: Four times a day (QID) | INTRAMUSCULAR | Status: AC
  Administered 2024-05-20 – 2024-05-21 (×4): 15 mg via INTRAVENOUS
  Filled 2024-05-20 (×4): qty 1

## 2024-05-20 MED ORDER — HYDROMORPHONE HCL 1 MG/ML IJ SOLN: 0.2500 mg | INTRAMUSCULAR | Status: DC | PRN

## 2024-05-20 MED ORDER — METOCLOPRAMIDE HCL 5 MG/ML IJ SOLN: 5.0000 mg | Freq: Three times a day (TID) | INTRAMUSCULAR | Status: DC | PRN

## 2024-05-20 MED ORDER — PHENYLEPHRINE 80 MCG/ML (10ML) SYRINGE FOR IV PUSH (FOR BLOOD PRESSURE SUPPORT)
PREFILLED_SYRINGE | INTRAVENOUS | Status: DC | PRN
  Administered 2024-05-20 (×2): 80 ug via INTRAVENOUS
  Administered 2024-05-20: 160 ug via INTRAVENOUS
  Administered 2024-05-20: 80 ug via INTRAVENOUS

## 2024-05-20 MED ORDER — FENTANYL CITRATE PF 50 MCG/ML IJ SOSY
50.0000 ug | PREFILLED_SYRINGE | INTRAMUSCULAR | Status: AC | PRN
  Administered 2024-05-20 (×2): 50 ug via INTRAVENOUS

## 2024-05-20 MED ORDER — 0.9 % SODIUM CHLORIDE (POUR BTL) OPTIME
TOPICAL | Status: DC | PRN
  Administered 2024-05-20: 1000 mL

## 2024-05-20 MED ORDER — POLYETHYLENE GLYCOL 3350 17 G PO PACK
17.0000 g | PACK | Freq: Every day | ORAL | Status: DC | PRN
Start: 2024-05-20 — End: 2024-05-21

## 2024-05-20 MED ORDER — AMLODIPINE BESYLATE 10 MG PO TABS
10.0000 mg | ORAL_TABLET | Freq: Every day | ORAL | Status: DC
  Administered 2024-05-21: 10 mg via ORAL
  Filled 2024-05-20: qty 1

## 2024-05-20 MED ORDER — OXYCODONE HCL 5 MG PO TABS: 5.0000 mg | ORAL_TABLET | Freq: Four times a day (QID) | ORAL | 0 refills | Status: DC | PRN

## 2024-05-20 MED ORDER — DROPERIDOL 2.5 MG/ML IJ SOLN: 0.6250 mg | Freq: Once | INTRAMUSCULAR | Status: DC | PRN

## 2024-05-20 MED ORDER — MIDAZOLAM HCL 2 MG/2ML IJ SOLN
INTRAMUSCULAR | Status: AC
  Filled 2024-05-20: qty 2

## 2024-05-20 MED ORDER — KCL IN DEXTROSE-NACL 20-5-0.45 MEQ/L-%-% IV SOLN
INTRAVENOUS | Status: DC
  Filled 2024-05-20 (×2): qty 1000

## 2024-05-20 MED ORDER — PHENYLEPHRINE 80 MCG/ML (10ML) SYRINGE FOR IV PUSH (FOR BLOOD PRESSURE SUPPORT)
PREFILLED_SYRINGE | INTRAVENOUS | Status: AC
  Filled 2024-05-20: qty 10

## 2024-05-20 MED ORDER — METHOCARBAMOL 500 MG PO TABS
ORAL_TABLET | ORAL | Status: AC
  Filled 2024-05-20: qty 1

## 2024-05-20 MED ORDER — CEFAZOLIN SODIUM-DEXTROSE 2-4 GM/100ML-% IV SOLN
2.0000 g | INTRAVENOUS | Status: AC
  Administered 2024-05-20: 2 g via INTRAVENOUS
  Filled 2024-05-20: qty 100

## 2024-05-20 MED ORDER — ONDANSETRON HCL 4 MG PO TABS: 4.0000 mg | ORAL_TABLET | Freq: Four times a day (QID) | ORAL | Status: DC | PRN

## 2024-05-20 MED ORDER — PROPOFOL 10 MG/ML IV BOLUS
INTRAVENOUS | Status: DC | PRN
  Administered 2024-05-20: 30 mg via INTRAVENOUS
  Administered 2024-05-20: 20 mg via INTRAVENOUS

## 2024-05-20 MED ORDER — CEFAZOLIN SODIUM-DEXTROSE 2-4 GM/100ML-% IV SOLN
2.0000 g | Freq: Four times a day (QID) | INTRAVENOUS | Status: AC
  Administered 2024-05-20 (×2): 2 g via INTRAVENOUS
  Filled 2024-05-20: qty 100

## 2024-05-20 MED ORDER — TRANEXAMIC ACID-NACL 1000-0.7 MG/100ML-% IV SOLN
INTRAVENOUS | Status: AC
  Filled 2024-05-20: qty 100

## 2024-05-20 MED ORDER — GLIPIZIDE ER 5 MG PO TB24
10.0000 mg | ORAL_TABLET | Freq: Every day | ORAL | Status: DC
Start: 2024-05-21 — End: 2024-05-21
  Administered 2024-05-21: 10 mg via ORAL
  Filled 2024-05-20: qty 2

## 2024-05-20 MED ORDER — ORAL CARE MOUTH RINSE: 15.0000 mL | Freq: Once | OROMUCOSAL | Status: AC

## 2024-05-20 MED ORDER — TRANEXAMIC ACID-NACL 1000-0.7 MG/100ML-% IV SOLN
1000.0000 mg | Freq: Once | INTRAVENOUS | Status: AC
  Administered 2024-05-20: 1000 mg via INTRAVENOUS

## 2024-05-20 MED ORDER — OXYCODONE HCL 5 MG PO TABS
5.0000 mg | ORAL_TABLET | Freq: Once | ORAL | Status: AC | PRN
  Administered 2024-05-20: 5 mg via ORAL

## 2024-05-20 MED ORDER — LACTATED RINGERS IV BOLUS
250.0000 mL | Freq: Once | INTRAVENOUS | Status: AC
  Administered 2024-05-20: 250 mL via INTRAVENOUS

## 2024-05-20 MED ORDER — TRANEXAMIC ACID 1000 MG/10ML IV SOLN
INTRAVENOUS | Status: DC | PRN
  Administered 2024-05-20: 2000 mg via TOPICAL

## 2024-05-20 MED ORDER — ROSUVASTATIN CALCIUM 10 MG PO TABS
10.0000 mg | ORAL_TABLET | Freq: Every day | ORAL | Status: DC
  Administered 2024-05-20: 10 mg via ORAL
  Filled 2024-05-20: qty 1

## 2024-05-20 MED ORDER — DOCUSATE SODIUM 100 MG PO CAPS
100.0000 mg | ORAL_CAPSULE | Freq: Two times a day (BID) | ORAL | Status: DC
  Administered 2024-05-20 – 2024-05-21 (×2): 100 mg via ORAL
  Filled 2024-05-20 (×2): qty 1

## 2024-05-20 MED ORDER — OXYCODONE HCL 5 MG PO TABS
5.0000 mg | ORAL_TABLET | Freq: Four times a day (QID) | ORAL | 0 refills | Status: AC | PRN
Start: 2024-05-20 — End: 2024-05-27

## 2024-05-20 MED ORDER — BUPIVACAINE LIPOSOME 1.3 % IJ SUSP
INTRAMUSCULAR | Status: AC
  Filled 2024-05-20: qty 20

## 2024-05-20 MED ORDER — GABAPENTIN 300 MG PO CAPS
300.0000 mg | ORAL_CAPSULE | Freq: Three times a day (TID) | ORAL | Status: DC
  Administered 2024-05-20 – 2024-05-21 (×3): 300 mg via ORAL
  Filled 2024-05-20 (×3): qty 1

## 2024-05-20 MED ORDER — DIPHENHYDRAMINE HCL 12.5 MG/5ML PO ELIX: 12.5000 mg | ORAL_SOLUTION | ORAL | Status: DC | PRN

## 2024-05-20 MED ORDER — MIDAZOLAM HCL 2 MG/2ML IJ SOLN
0.5000 mg | Freq: Once | INTRAMUSCULAR | Status: AC
  Administered 2024-05-20: 2 mg via INTRAVENOUS
  Filled 2024-05-20: qty 2

## 2024-05-20 MED ORDER — PROPOFOL 1000 MG/100ML IV EMUL
INTRAVENOUS | Status: AC
  Filled 2024-05-20: qty 100

## 2024-05-20 MED ORDER — METOCLOPRAMIDE HCL 5 MG PO TABS: 5.0000 mg | ORAL_TABLET | Freq: Three times a day (TID) | ORAL | Status: DC | PRN

## 2024-05-20 MED ORDER — PROPOFOL 500 MG/50ML IV EMUL
INTRAVENOUS | Status: DC | PRN
  Administered 2024-05-20: 75 ug/kg/min via INTRAVENOUS

## 2024-05-20 MED ORDER — FENTANYL CITRATE PF 50 MCG/ML IJ SOSY
25.0000 ug | PREFILLED_SYRINGE | Freq: Once | INTRAMUSCULAR | Status: AC
  Administered 2024-05-20: 50 ug via INTRAVENOUS
  Filled 2024-05-20: qty 2

## 2024-05-20 MED ORDER — INSULIN ASPART 100 UNIT/ML IJ SOLN: 0.0000 [IU] | INTRAMUSCULAR | Status: DC | PRN

## 2024-05-20 MED ORDER — METHOCARBAMOL 1000 MG/10ML IJ SOLN: 500.0000 mg | Freq: Four times a day (QID) | INTRAMUSCULAR | Status: DC | PRN

## 2024-05-20 MED ORDER — LISINOPRIL 20 MG PO TABS
20.0000 mg | ORAL_TABLET | Freq: Every day | ORAL | Status: DC
  Administered 2024-05-21: 20 mg via ORAL
  Filled 2024-05-20: qty 1

## 2024-05-20 MED ORDER — CEFAZOLIN SODIUM-DEXTROSE 2-4 GM/100ML-% IV SOLN
INTRAVENOUS | Status: AC
  Filled 2024-05-20: qty 100

## 2024-05-20 MED ORDER — BUPIVACAINE LIPOSOME 1.3 % IJ SUSP: 10.0000 mL | Freq: Once | INTRAMUSCULAR | Status: DC

## 2024-05-20 MED ORDER — ROPIVACAINE HCL 5 MG/ML IJ SOLN
INTRAMUSCULAR | Status: DC | PRN
  Administered 2024-05-20: 30 mL via PERINEURAL

## 2024-05-20 MED ORDER — HYDROCHLOROTHIAZIDE 25 MG PO TABS
25.0000 mg | ORAL_TABLET | Freq: Every day | ORAL | Status: DC
  Administered 2024-05-21: 25 mg via ORAL
  Filled 2024-05-20: qty 1

## 2024-05-20 MED ORDER — ONDANSETRON HCL 4 MG/2ML IJ SOLN: 4.0000 mg | Freq: Four times a day (QID) | INTRAMUSCULAR | Status: DC | PRN

## 2024-05-20 MED ORDER — ONDANSETRON HCL 4 MG/2ML IJ SOLN
INTRAMUSCULAR | Status: DC | PRN
  Administered 2024-05-20: 4 mg via INTRAVENOUS

## 2024-05-20 MED ORDER — POVIDONE-IODINE 10 % EX SWAB: 2.0000 | Freq: Once | CUTANEOUS | Status: DC

## 2024-05-20 MED ORDER — OXYCODONE HCL 5 MG PO TABS
5.0000 mg | ORAL_TABLET | ORAL | Status: DC | PRN
  Administered 2024-05-20 – 2024-05-21 (×3): 5 mg via ORAL
  Filled 2024-05-20 (×3): qty 1

## 2024-05-20 MED ORDER — LACTATED RINGERS IV BOLUS: 250.0000 mL | Freq: Once | INTRAVENOUS | Status: AC

## 2024-05-20 MED ORDER — BUPIVACAINE IN DEXTROSE 0.75-8.25 % IT SOLN
INTRATHECAL | Status: DC | PRN
  Administered 2024-05-20: 1.8 mL via INTRATHECAL

## 2024-05-20 MED ORDER — ASPIRIN 81 MG PO TBEC: 81.0000 mg | DELAYED_RELEASE_TABLET | Freq: Two times a day (BID) | ORAL | 0 refills | Status: AC

## 2024-05-20 MED ORDER — LACTATED RINGERS IV SOLN: INTRAVENOUS | Status: DC

## 2024-05-20 MED ORDER — METHOCARBAMOL 500 MG PO TABS
500.0000 mg | ORAL_TABLET | Freq: Four times a day (QID) | ORAL | Status: DC | PRN
Start: 2024-05-20 — End: 2024-05-21
  Administered 2024-05-20 – 2024-05-21 (×3): 500 mg via ORAL
  Filled 2024-05-20 (×2): qty 1

## 2024-05-20 MED ORDER — ALUM & MAG HYDROXIDE-SIMETH 200-200-20 MG/5ML PO SUSP: 30.0000 mL | ORAL | Status: DC | PRN

## 2024-05-20 SURGICAL SUPPLY — 41 items
ATTUNE MED DOME PAT 38 KNEE (Knees) IMPLANT
BAG COUNTER SPONGE SURGICOUNT (BAG) IMPLANT
BAG DECANTER FOR FLEXI CONT (MISCELLANEOUS) ×2 IMPLANT
BAG ZIPLOCK 12X15 (MISCELLANEOUS) ×2 IMPLANT
BLADE SAG 18X100X1.27 (BLADE) ×2 IMPLANT
BLADE SAW SGTL 11.0X1.19X90.0M (BLADE) IMPLANT
BNDG ELASTIC 6INX 5YD STR LF (GAUZE/BANDAGES/DRESSINGS) IMPLANT
BNDG ELASTIC 6X10 VLCR STRL LF (GAUZE/BANDAGES/DRESSINGS) ×2 IMPLANT
BOWL SMART MIX CTS (DISPOSABLE) IMPLANT
CEMENT BONE SMARTSET CMW 20G (Cement) IMPLANT
COMP FEM ATT CMTLS 5 LT (Joint) IMPLANT
COVER SURGICAL LIGHT HANDLE (MISCELLANEOUS) ×2 IMPLANT
DRAPE INCISE IOBAN 66X45 STRL (DRAPES) IMPLANT
DRAPE U-SHAPE 47X51 STRL (DRAPES) ×2 IMPLANT
DRSG AQUACEL AG ADV 3.5X10 (GAUZE/BANDAGES/DRESSINGS) ×2 IMPLANT
DURAPREP 26ML APPLICATOR (WOUND CARE) ×2 IMPLANT
ELECT REM PT RETURN 15FT ADLT (MISCELLANEOUS) ×2 IMPLANT
GLOVE BIO SURGEON STRL SZ7.5 (GLOVE) ×2 IMPLANT
GLOVE BIOGEL PI IND STRL 8 (GLOVE) ×2 IMPLANT
GOWN STRL REUS W/ TWL LRG LVL3 (GOWN DISPOSABLE) ×2 IMPLANT
HOOD PEEL AWAY T7 (MISCELLANEOUS) ×6 IMPLANT
INSERT MED ATTUNE KNEE 5 6 LT (Insert) IMPLANT
INSERT TIB CMT ATTUNE 7 (Insert) IMPLANT
KIT TURNOVER KIT A (KITS) ×2 IMPLANT
NDL SAFETY ECLIPSE 18X1.5 (NEEDLE) ×4 IMPLANT
NS IRRIG 1000ML POUR BTL (IV SOLUTION) ×2 IMPLANT
PACK TOTAL KNEE CUSTOM (KITS) ×2 IMPLANT
PENCIL SMOKE EVACUATOR (MISCELLANEOUS) ×2 IMPLANT
PIN FIX SIGMA LCS THRD HI (PIN) IMPLANT
PROTECTOR NERVE ULNAR (MISCELLANEOUS) ×2 IMPLANT
SET HNDPC FAN SPRY TIP SCT (DISPOSABLE) ×2 IMPLANT
SUT ETHIBOND NAB CT1 #1 30IN (SUTURE) ×2 IMPLANT
SUT VIC AB 0 CT1 36 (SUTURE) ×2 IMPLANT
SUT VIC AB 2-0 CT1 TAPERPNT 27 (SUTURE) ×2 IMPLANT
SUT VIC AB 3-0 FS2 27 (SUTURE) ×2 IMPLANT
SUTURE STRATFX 0 PDS 27 VIOLET (SUTURE) ×2 IMPLANT
TOWEL GREEN STERILE FF (TOWEL DISPOSABLE) ×2 IMPLANT
TRAY FOLEY MTR SLVR 16FR STAT (SET/KITS/TRAYS/PACK) ×2 IMPLANT
TUBE SUCTION HIGH CAP CLEAR NV (SUCTIONS) ×2 IMPLANT
WATER STERILE IRR 1000ML POUR (IV SOLUTION) ×4 IMPLANT
WRAP KNEE MAXI GEL POST OP (GAUZE/BANDAGES/DRESSINGS) IMPLANT

## 2024-05-20 NOTE — Anesthesia Procedure Notes (Signed)
 Procedure Name: MAC Date/Time: 05/20/2024 10:32 AM  Performed by: Judythe Tanda Aran, CRNAPre-anesthesia Checklist: Patient identified, Emergency Drugs available, Suction available and Patient being monitored Patient Re-evaluated:Patient Re-evaluated prior to induction Oxygen Delivery Method: Simple face mask
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
## 2024-05-20 NOTE — Anesthesia Procedure Notes (Signed)
 Anesthesia Regional Block: Adductor canal block   Pre-Anesthetic Checklist: , timeout performed,  Correct Patient, Correct Site, Correct Laterality,  Correct Procedure, Correct Position, site marked,  Risks and benefits discussed,  Surgical consent,  Pre-op evaluation,  At surgeon's request and post-op pain management  Laterality: Left  Prep: chloraprep       Needles:  Injection technique: Single-shot  Needle Type: Echogenic Stimulator Needle     Needle Length: 10cm  Needle Gauge: 21   Needle insertion depth: 7 cm   Additional Needles:   Procedures:,,,, ultrasound used (permanent image in chart),,    Narrative:  Start time: 05/20/2024 10:14 AM End time: 05/20/2024 10:19 AM Injection made incrementally with aspirations every 5 mL.  Performed by: Personally  Anesthesiologist: Jerrye Sharper, MD  Additional Notes: Timeout performed. Patient sedated. Relevant anatomy ID'd using US . Incremental 2-5ml injection of LA with frequent aspiration. Patient tolerated procedure well.

## 2024-05-20 NOTE — Plan of Care (Signed)
   Problem: Activity: Goal: Risk for activity intolerance will decrease Outcome: Progressing   Problem: Pain Managment: Goal: General experience of comfort will improve and/or be controlled Outcome: Progressing   Problem: Safety: Goal: Ability to remain free from injury will improve Outcome: Progressing

## 2024-05-20 NOTE — Discharge Instructions (Addendum)

## 2024-05-20 NOTE — Progress Notes (Signed)
 Orthopedic Tech Progress Note Patient Details:  Jeremiah Baxter [birth year] [id number]  Ortho Devices Type of Ortho Device: Bone foam zero knee Ortho Device/Splint Location: left Ortho Device/Splint Interventions: Ordered, Application, Adjustment   Post Interventions Patient Tolerated: Well Instructions Provided: Adjustment of device, Care of device  Waylan Thom Loving 05/20/2024, 2:09 PM

## 2024-05-20 NOTE — Anesthesia Procedure Notes (Signed)
 Spinal  Patient location during procedure: OR Start time: 05/20/2024 10:33 AM End time: 05/20/2024 10:36 AM Reason for block: surgical anesthesia Staffing Performed by: Jerrye Sharper, MD Authorized by: Jerrye Sharper, MD   Preanesthetic Checklist Completed: patient identified, IV checked, site marked, risks and benefits discussed, surgical consent, monitors and equipment checked, pre-op evaluation and timeout performed Spinal Block Patient position: sitting Prep: DuraPrep and site prepped and draped Patient monitoring: heart rate, cardiac monitor, continuous pulse ox and blood pressure Approach: midline Location: L3-4 Injection technique: single-shot Needle Needle type: Pencan  Needle gauge: 24 G Needle length: 9 cm Needle insertion depth: 7 cm Assessment Sensory level: T6 Events: CSF return Additional Notes Patient tolerated procedure well. Adequate sensory level.
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
## 2024-05-20 NOTE — Transfer of Care (Signed)
 Immediate Anesthesia Transfer of Care Note  Patient: Jeremiah Baxter  Procedure(s) Performed: ARTHROPLASTY, KNEE, TOTAL (Left: Knee)  Patient Location: PACU  Anesthesia Type:Spinal  Level of Consciousness: awake  Airway & Oxygen Therapy: Patient Spontanous Breathing  Post-op Assessment: Report given to RN and Post -op Vital signs reviewed and stable  Post vital signs: Reviewed and stable  Last Vitals:  Vitals Value Taken Time  BP 116/66 05/20/24 13:06  Temp    Pulse 72 05/20/24 13:07  Resp 16 05/20/24 13:07  SpO2 96 % 05/20/24 13:07  Vitals shown include unfiled device data.  Last Pain:  Vitals:   05/20/24 1025  TempSrc:   PainSc: 0-No pain         Complications: No notable events documented.

## 2024-05-21 DIAGNOSIS — M1712 Unilateral primary osteoarthritis, left knee: Secondary | ICD-10-CM | POA: Diagnosis not present

## 2024-05-21 LAB — GLUCOSE, CAPILLARY
Glucose-Capillary: 216 mg/dL — ABNORMAL HIGH (ref 70–99)
Glucose-Capillary: 322 mg/dL — ABNORMAL HIGH (ref 70–99)

## 2024-05-21 MED ORDER — INSULIN ASPART 100 UNIT/ML IJ SOLN
0.0000 [IU] | INTRAMUSCULAR | Status: DC
  Administered 2024-05-21: 16 [IU] via SUBCUTANEOUS
  Administered 2024-05-21: 8 [IU] via SUBCUTANEOUS

## 2024-05-21 NOTE — Plan of Care (Signed)
   Problem: Coping: Goal: Level of anxiety will decrease Outcome: Progressing   Problem: Pain Managment: Goal: General experience of comfort will improve and/or be controlled Outcome: Progressing   Problem: Safety: Goal: Ability to remain free from injury will improve Outcome: Progressing
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
## 2024-05-21 NOTE — Progress Notes (Signed)
 Spot of bloody drainage about the size of a quarter noted to be coming through the ace wrap onto the ted hose, removed ted hose and ace, aquacel dressing completely saturated to the edges with bloody drainage, removed old dressing, wiped around incision with sterile gauze, no active bleeding noted to incision line, no open areas, new aquacel dressing applied and new compression bandage applied, patient states pain much better than yesterday and is eager to work with physical therapy.
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
## 2024-05-21 NOTE — Op Note (Signed)
 .Orthopaedic Surgery Operative Note (CSN: [id number])  Jeremiah Baxter  1956/03/27 Date of Surgery: 05/20/2024   Diagnoses:  LEFT KNEE OSTEOARTHRITIS  Procedure: Left total knee arthroplasty   Operative Finding Successful completion of the planned procedure.    Post-operative plan: The patient will be WBAT.  DVT prophylaxis Aspirin  81 mg twice daily for 6 weeks.   Pain control with PRN pain medication preferring oral medicines.  Follow up plan will be scheduled in approximately 14 days for incision check and XR.  Post-Op Diagnosis: Same Surgeons:Primary: Sherida Adine BROCKS, MD Assistants:Blair Henry RIGGERS Location: [facility] ROOM 07 Anesthesia: Spinal Antibiotics: Ancef  2 g Tourniquet time:  Total Tourniquet Time Documented: Thigh (Left) - 76 minutes Total: Thigh (Left) - 76 minutes  Estimated Blood Loss: 100 cc Complications: None Specimens: None Implants: Implant Name Type Inv. Item Serial No. Manufacturer Lot No. LRB No. Used Action  CEMENT BONE SMARTSET CMW 20G - [id number] Cement CEMENT BONE SMARTSET CMW 20G  [employer] [id number] Left 1 Implanted  ATTUNE MED DOME PAT 38 KNEE - [id number] Knees ATTUNE MED DOME PAT 38 KNEE  [employer] [id number] Left 1 Implanted  ATTUNE  FEMORAL POROCOAT SZ 5 LEFT CEMENTLESS    [employer] [id number] Left 1 Implanted  INSERT TIB CMT ATTUNE 7 - [id number] Insert INSERT TIB CMT ATTUNE 7  [employer] [id number] Left 1 Implanted  INSERT MED ATTUNE KNEE 5 6 LT - [id number] Insert INSERT MED ATTUNE KNEE 5 6 LT  [employer] [id number] Left 1 Implanted      INDICATIONS FOR PROCEDURE: The patient has  LEFT KNEE OSTEOARTHRITIS, varus deformities, XR shows bone on bone arthritis, lateral subluxation of tibia. Patient has failed all conservative measures including anti-inflammatory medicines, narcotics, attempts at exercise and weight loss, cortisone injections and viscosupplementation.  Risks and benefits of surgery have been  discussed, questions answered.   DESCRIPTION OF PROCEDURE: The patient identified by armband, received  IV antibiotics, in the holding area at [facility]. Patient taken to the operating room, appropriate anesthetic monitors were attached, and spinal anesthesia was  induced. IV Tranexamic acid  was given. Lateral post and 2 surefoot positioners applied to the table, and a non sterile tourniquet applied. The lower extremity was then prepped and draped in usual sterile fashion from the toes to the high thigh. Time-out procedure was performed. Almeda Henry PA-C, was present and scrubbed throughout the case, critical for assistance with, positioning, exposure, retraction, instrumentation, and closure. An Esmarch was used to exsanguinate the leg and the tourniquet inflated to 250 mmHg.   We began the operation, with the knee flexed 90 degrees, by making the anterior midline incision starting at handbreadth above the patella going over the patella 1 cm medial to and 4 cm distal to the tibial tubercle. Small bleeders in the skin and the subcutaneous tissue identified and cauterized. Transverse retinaculum was incised and reflected medially and a medial parapatellar arthrotomy was accomplished. the patella was everted and theprepatellar fat pad resected. The superficial medial collateral ligament was then elevated from anterior to posterior along the proximal flare of the tibia and anterior half of the menisci resected. The knee was hyperflexed exposing bone on bone arthritis. Peripheral and notch osteophytes as well as the anterior cruciate ligament was then resected.  We then entered the distal femur 2 mm anterior to the PCL origin with the starter drill, followed by the intramedullary guide rod and applied the distal femoral cutting guide set at 10 mm, with 5 degrees of  valgus. This was pinned along the epicondylar axis. At this point, the distal femoral cut was accomplished without difficulty.   The  knee was hyper flexed and we continued to work our way around posteriorly along the proximal tibia, and externally rotated the tibia subluxing it out from underneath the femur. A McHale PCL retractor was placed through the notch, a lateral Hohmann retractor, and anterolateral small homan retractor placed. An extramedullary tibial cut guide was placed and was pinned into place allowing resection of 2 mm of bone medially and 10 mm of bone laterally. Satisfied with the tibial resection, We then sized for a #5 femoral component and pinned the chamfer guide in 3 degrees of external rotation. The anterior, posterior, and chamfer cuts were accomplished without difficulty followed by the Attune medial stabilized sulcus cutting guide. This cut was performed. We also removed posterior osteophytes from the posterior femoral condyles. The posterior capsule was injected with Exparel  solution. The knee was brought into full extension. We checked our extension gap and fit a 6 mm trial lollipop. Posterior medial and posterior lateral gutter were cauterized.  The transexamic acid-soaked sponge was then placed in the gap of the knee in extension. The knee was flexed 30. The posterior patella cut was accomplished with the 9.5 mm Attune cutting guide, sized for a 38 mm dome, and the fixation pegs drilled.The knee was then once again hyperflexed exposing the proximal tibia. We sized for a # 7 tibial base plate, applied the smokestack and the conical reamer followed by the the Delta fin keel punch. The patient had excellent bone quality and a press-fit implant was desired.  The tibia was prepped for press-fit implantation.  We then hammered into place the Attune RP trial femoral component, drilled the lugs, inserted a  6 mm trial bearing, trial patellar button, and took the knee through range of motion from 0-130 degrees. Medial and lateral ligamentous stability was checked. No thumb pressure was required for patellar tracking.  All  trial components were removed, mating surfaces irrigated with pulse lavage, and dried with suction and sponges. Exparel  solution was applied to the cancellus bone of the patella distal femur and proximal tibia.    In order, we hammered into place the tibial tray and the femoral component. The final Attune medial stabilized bearing was inserted, and the knee brought to full extension with compression. The patellar button was clamped into place, and excess cement removed. The knee was held at 30 flexion with compression using the second surefoot. The wound was irrigated out with normal saline solution pulse lavage. The rest of the Exparel  was injected into the parapatellar arthrotomy, subcutaneous tissues, and periosteal tissues. The tourniquet was released and hemostasis obtained.The parapatellar arthrotomy was closed with interrupted #1 Ethibond suture, followed by a #0 Stratafix.The subcutaneous tissue with #0 Vicryl, 2-0 undyed Vicryl suture, and the skin with running 3-0 SQ Vicryl. An Aquacil dressing and Ace wrap were applied. The patient was taken to recovery room without difficulty.   Adine JAYSON Mon 05/21/2024, 8:33 AM

## 2024-05-22 NOTE — Anesthesia Postprocedure Evaluation (Signed)
 Anesthesia Post Note  Patient: Jeremiah Baxter  Procedure(s) Performed: ARTHROPLASTY, KNEE, TOTAL (Left: Knee)     Patient location during evaluation: PACU Anesthesia Type: Spinal Level of consciousness: awake and alert Pain management: pain level controlled Vital Signs Assessment: post-procedure vital signs reviewed and stable Respiratory status: spontaneous breathing Cardiovascular status: stable Anesthetic complications: no   No notable events documented.  Last Vitals:  Vitals:   05/21/24 0547 05/21/24 0923  BP: (!) 179/90 119/69  Pulse: 82 77  Resp: 17 16  Temp: 36.7 C 36.6 C  SpO2: 98% 96%    Last Pain:  Vitals:   05/21/24 1421  TempSrc:   PainSc: 5                  Norleen Pope

## 2024-05-23 ENCOUNTER — Encounter (HOSPITAL_COMMUNITY): Payer: Self-pay | Admitting: Orthopedic Surgery

## 2024-05-28 ENCOUNTER — Ambulatory Visit (HOSPITAL_BASED_OUTPATIENT_CLINIC_OR_DEPARTMENT_OTHER)

## 2024-05-28 ENCOUNTER — Other Ambulatory Visit (HOSPITAL_BASED_OUTPATIENT_CLINIC_OR_DEPARTMENT_OTHER): Payer: Self-pay | Admitting: Orthopaedic Surgery

## 2024-05-28 DIAGNOSIS — M7989 Other specified soft tissue disorders: Secondary | ICD-10-CM | POA: Diagnosis not present

## 2024-05-28 DIAGNOSIS — M79662 Pain in left lower leg: Secondary | ICD-10-CM
